# Patient Record
Sex: Male | Born: 1965 | Race: White | Hispanic: Yes | Marital: Married | Smoking: Former smoker
Health system: Southern US, Community
[De-identification: ages and names within clinical notes are randomized; demographics above are authoritative.]

## PROBLEM LIST (undated history)

## (undated) DIAGNOSIS — E119 Type 2 diabetes mellitus without complications: Secondary | ICD-10-CM

## (undated) DIAGNOSIS — N189 Chronic kidney disease, unspecified: Secondary | ICD-10-CM

## (undated) DIAGNOSIS — I10 Essential (primary) hypertension: Secondary | ICD-10-CM

## (undated) HISTORY — PX: TOE AMPUTATION: SHX809

---

## 2005-01-01 ENCOUNTER — Emergency Department (HOSPITAL_COMMUNITY): Admission: EM | Admit: 2005-01-01 | Discharge: 2005-01-01 | Payer: Self-pay | Admitting: Emergency Medicine

## 2009-07-08 ENCOUNTER — Inpatient Hospital Stay (HOSPITAL_COMMUNITY): Admission: EM | Admit: 2009-07-08 | Discharge: 2009-07-12 | Payer: Self-pay | Admitting: Emergency Medicine

## 2009-07-08 ENCOUNTER — Ambulatory Visit: Payer: Self-pay | Admitting: Vascular Surgery

## 2009-07-09 ENCOUNTER — Encounter (INDEPENDENT_AMBULATORY_CARE_PROVIDER_SITE_OTHER): Payer: Self-pay | Admitting: Internal Medicine

## 2009-07-13 ENCOUNTER — Encounter: Payer: Self-pay | Admitting: Internal Medicine

## 2009-07-27 ENCOUNTER — Emergency Department (HOSPITAL_COMMUNITY): Admission: EM | Admit: 2009-07-27 | Discharge: 2009-07-27 | Payer: Self-pay | Admitting: Emergency Medicine

## 2009-08-11 ENCOUNTER — Ambulatory Visit: Payer: Self-pay | Admitting: Internal Medicine

## 2010-01-07 ENCOUNTER — Ambulatory Visit: Payer: Self-pay | Admitting: Internal Medicine

## 2010-01-07 LAB — CONVERTED CEMR LAB
ALT: 25 units/L (ref 0–53)
AST: 21 units/L (ref 0–37)
Albumin: 4.3 g/dL (ref 3.5–5.2)
Alkaline Phosphatase: 99 units/L (ref 39–117)
BUN: 14 mg/dL (ref 6–23)
CO2: 25 meq/L (ref 19–32)
Calcium: 9.6 mg/dL (ref 8.4–10.5)
Creatinine, Ser: 0.8 mg/dL (ref 0.40–1.50)
Total Protein: 7.1 g/dL (ref 6.0–8.3)

## 2010-01-21 ENCOUNTER — Ambulatory Visit: Payer: Self-pay | Admitting: Internal Medicine

## 2010-02-03 ENCOUNTER — Ambulatory Visit: Payer: Self-pay | Admitting: Internal Medicine

## 2010-03-15 ENCOUNTER — Ambulatory Visit: Payer: Self-pay | Admitting: Internal Medicine

## 2010-03-15 LAB — CONVERTED CEMR LAB
Creatinine, Ser: 0.96 mg/dL (ref 0.40–1.50)
HDL: 43 mg/dL (ref >39)
Microalb, Ur: 45.25 mg/dL — ABNORMAL HIGH (ref 0.00–1.89)
Total CHOL/HDL Ratio: 6.3

## 2010-03-23 ENCOUNTER — Ambulatory Visit: Payer: Self-pay | Admitting: Internal Medicine

## 2010-04-13 ENCOUNTER — Ambulatory Visit: Payer: Self-pay | Admitting: Internal Medicine

## 2010-05-04 ENCOUNTER — Ambulatory Visit: Payer: Self-pay | Admitting: Internal Medicine

## 2010-05-04 LAB — CONVERTED CEMR LAB
BUN: 14 mg/dL (ref 6–23)
CO2: 25 meq/L (ref 19–32)
Chloride: 104 meq/L (ref 96–112)
LDL Cholesterol: 102 mg/dL — ABNORMAL HIGH (ref 0–99)
Sodium: 139 meq/L (ref 135–145)

## 2010-05-11 ENCOUNTER — Ambulatory Visit: Payer: Self-pay | Admitting: Internal Medicine

## 2010-05-12 ENCOUNTER — Ambulatory Visit: Payer: Self-pay | Admitting: Internal Medicine

## 2010-06-15 ENCOUNTER — Ambulatory Visit: Payer: Self-pay | Admitting: Internal Medicine

## 2010-06-22 ENCOUNTER — Ambulatory Visit: Payer: Self-pay | Admitting: Internal Medicine

## 2010-12-20 NOTE — Progress Notes (Signed)
Summary: [facility] Nurses Note  [facility] Nurses Note   Imported By: Florinda Marker 05/10/2010 15:05:43  _____________________________________________________________________  External Attachment:    Type:   Image     Comment:   External Document

## 2011-01-11 IMAGING — CR DG FOOT COMPLETE 3+V*R*
3 series · 3 of 3 positions shown · non-contrast
Comparison: None.

CLINICAL DATA: Toe pain.  Abscess.

RIGHT FOOT COMPLETE - 3+ VIEW

[t foot ap right]
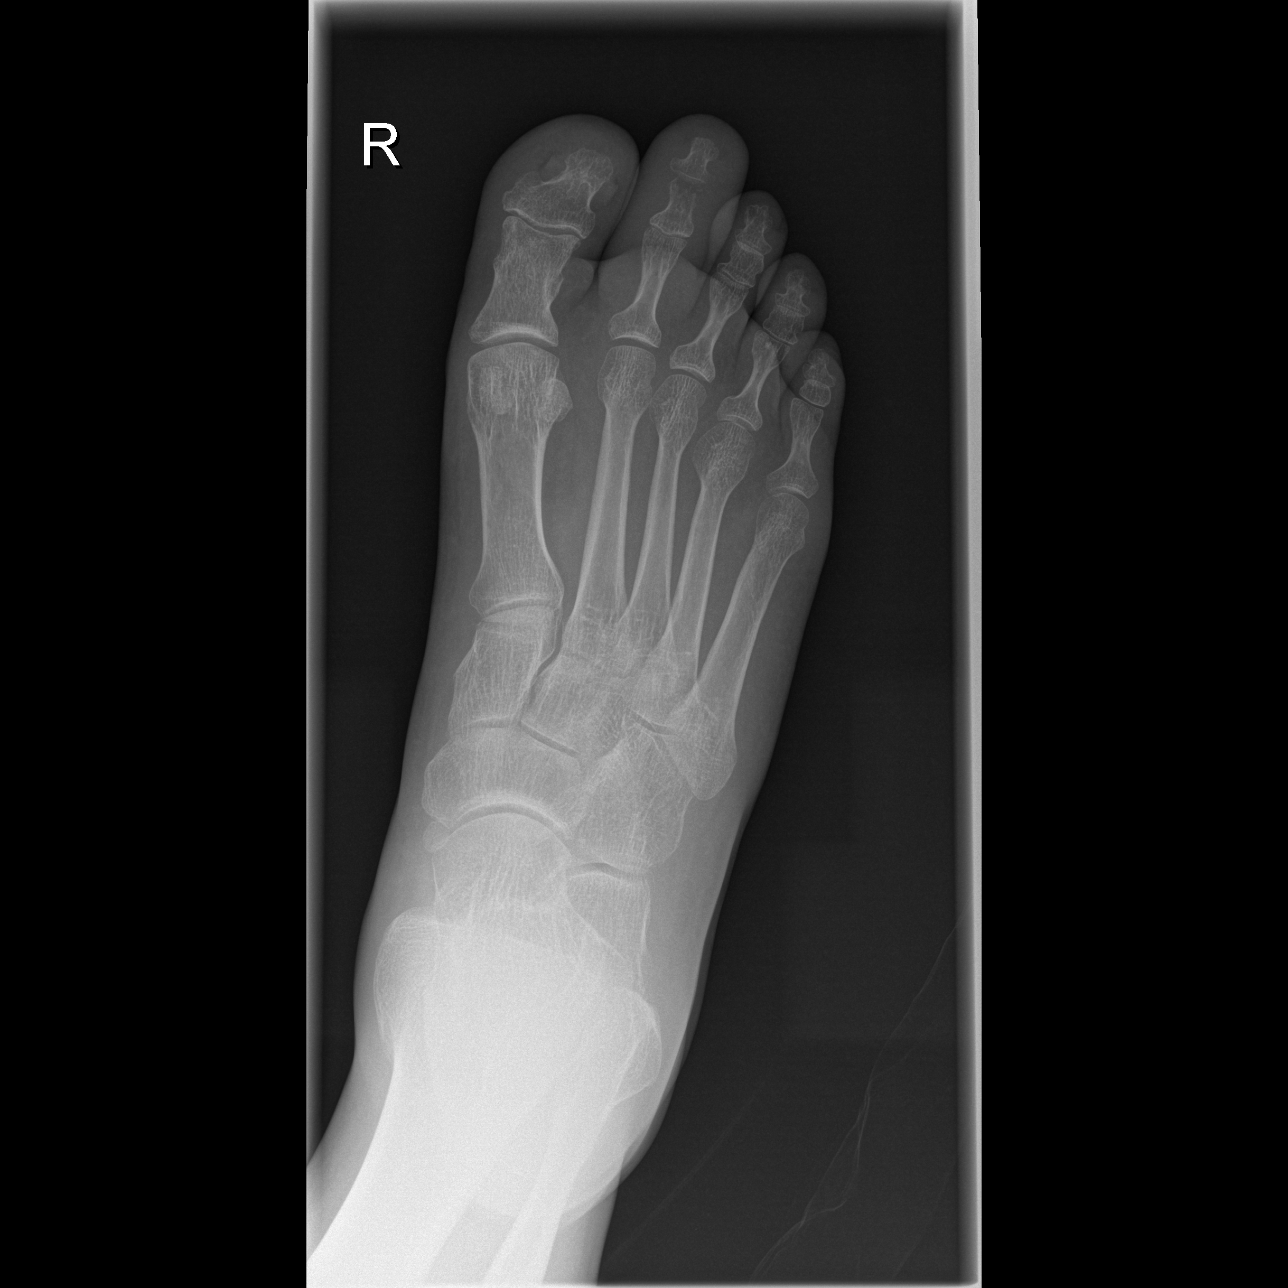

[t foot oblique right]
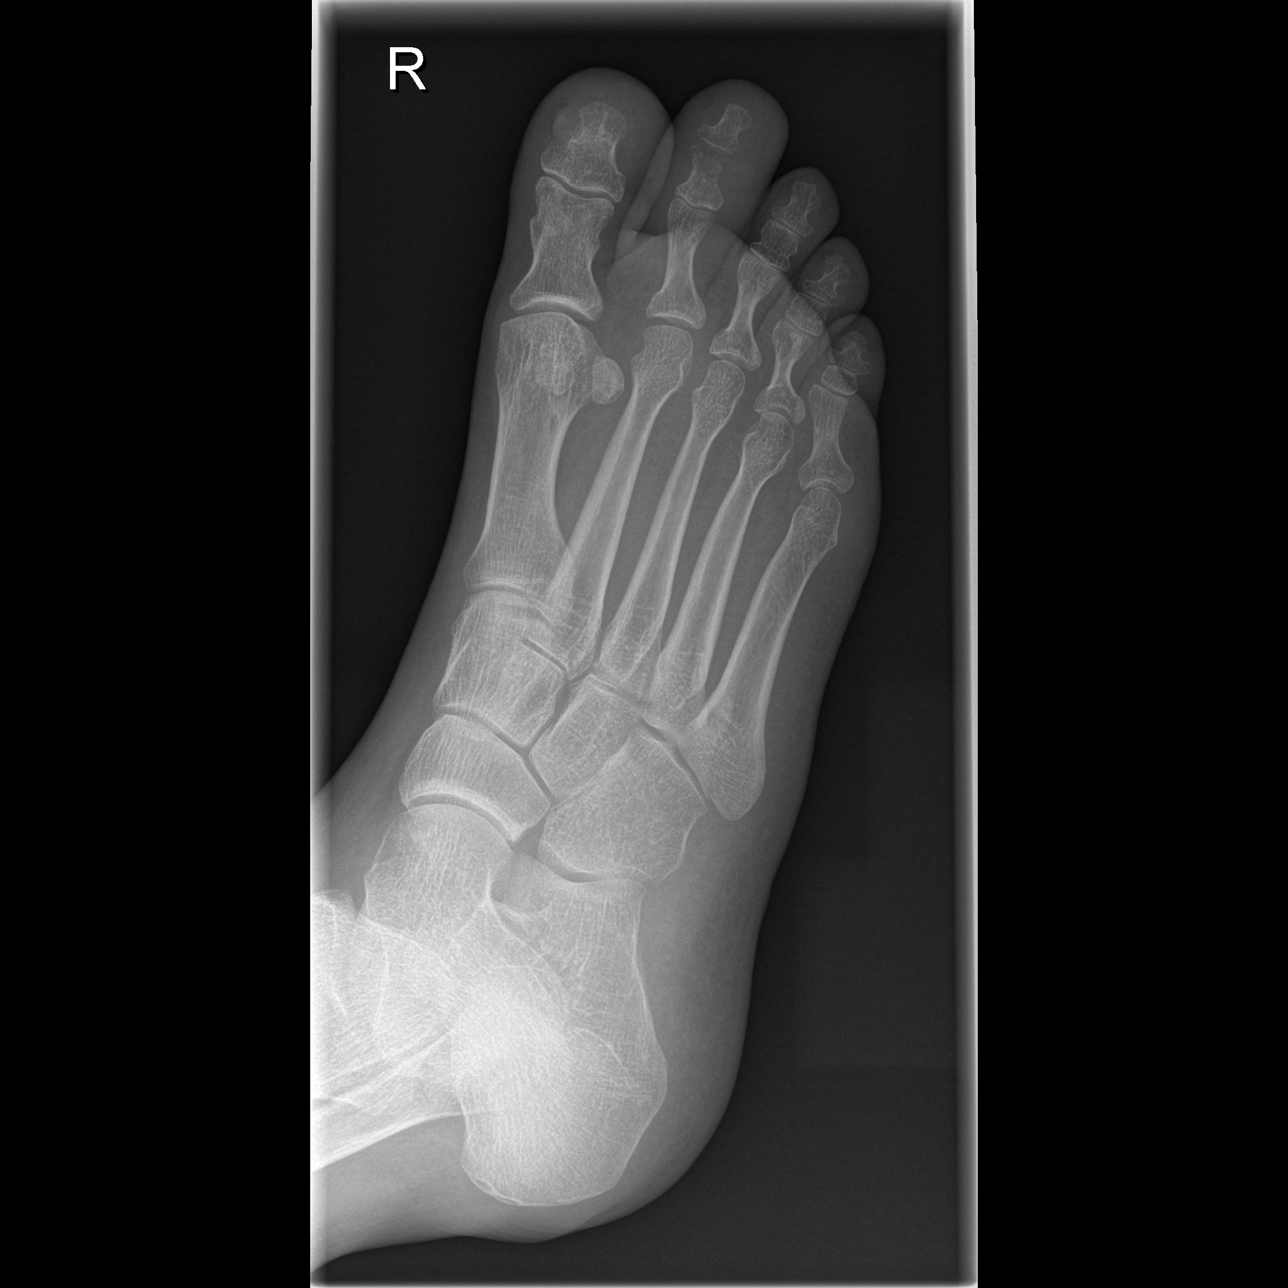

[t foot lat right]
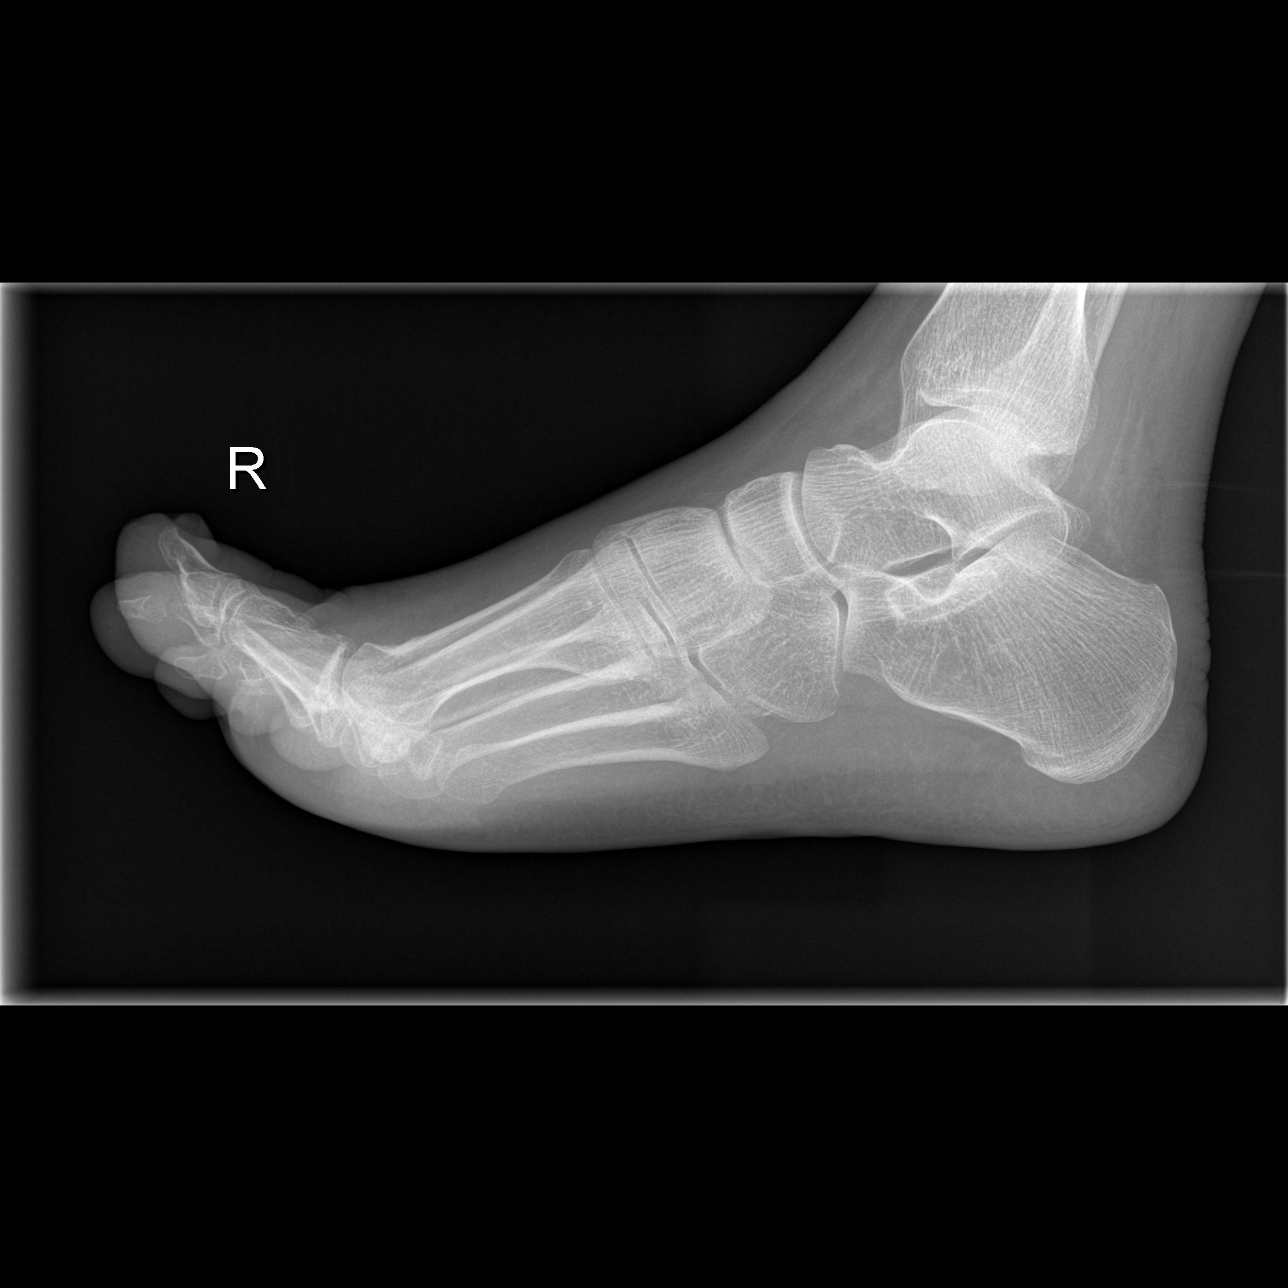

[3 of 3 positions shown; findings below may reference images not displayed]

FINDINGS: There is osteolysis in the distal aspect of the middle
phalanx of the second toe consistent with active osteomyelitis.
The cortex of the distal phalanx appears to remain intact however
septic joint cannot be excluded. There is soft tissue swelling over
the plantar aspect of the great toe.  No gas is present in the
subcutaneous tissues.  Accessory navicular bone is present.  Mild
edema is present in the foot.
IMPRESSION: 1.  Distal aspect middle phalanx second toe osteomyelitis.  Septic
second toe DIP joint not excluded.

## 2011-02-25 LAB — GLUCOSE, CAPILLARY
Glucose-Capillary: 127 mg/dL — ABNORMAL HIGH (ref 70–99)
Glucose-Capillary: 140 mg/dL — ABNORMAL HIGH (ref 70–99)
Glucose-Capillary: 167 mg/dL — ABNORMAL HIGH (ref 70–99)
Glucose-Capillary: 168 mg/dL — ABNORMAL HIGH (ref 70–99)
Glucose-Capillary: 182 mg/dL — ABNORMAL HIGH (ref 70–99)
Glucose-Capillary: 195 mg/dL — ABNORMAL HIGH (ref 70–99)
Glucose-Capillary: 204 mg/dL — ABNORMAL HIGH (ref 70–99)
Glucose-Capillary: 222 mg/dL — ABNORMAL HIGH (ref 70–99)
Glucose-Capillary: 236 mg/dL — ABNORMAL HIGH (ref 70–99)
Glucose-Capillary: 261 mg/dL — ABNORMAL HIGH (ref 70–99)
Glucose-Capillary: 350 mg/dL — ABNORMAL HIGH (ref 70–99)
Glucose-Capillary: 371 mg/dL — ABNORMAL HIGH (ref 70–99)
Glucose-Capillary: 440 mg/dL — ABNORMAL HIGH (ref 70–99)

## 2011-02-25 LAB — DIFFERENTIAL
Basophils Absolute: 0 10*3/uL (ref 0.0–0.1)
Eosinophils Absolute: 0.5 10*3/uL (ref 0.0–0.7)
Monocytes Absolute: 1.3 10*3/uL — ABNORMAL HIGH (ref 0.1–1.0)
Neutrophils Relative %: 74 % (ref 43–77)

## 2011-02-25 LAB — CBC
HCT: 32.9 % — ABNORMAL LOW (ref 39.0–52.0)
HCT: 37.2 % — ABNORMAL LOW (ref 39.0–52.0)
Hemoglobin: 11.3 g/dL — ABNORMAL LOW (ref 13.0–17.0)
MCHC: 33.8 g/dL (ref 30.0–36.0)
MCV: 85.1 fL (ref 78.0–100.0)
Platelets: 318 10*3/uL (ref 150–400)
Platelets: 337 10*3/uL (ref 150–400)
Platelets: 386 10*3/uL (ref 150–400)
RBC: 3.9 MIL/uL — ABNORMAL LOW (ref 4.22–5.81)
RDW: 12.3 % (ref 11.5–15.5)
WBC: 12.7 10*3/uL — ABNORMAL HIGH (ref 4.0–10.5)
WBC: 9.2 10*3/uL (ref 4.0–10.5)

## 2011-02-25 LAB — BASIC METABOLIC PANEL
BUN: 7 mg/dL (ref 6–23)
Calcium: 9.4 mg/dL (ref 8.4–10.5)
Chloride: 96 mEq/L (ref 96–112)
GFR calc non Af Amer: >60 mL/min (ref >60)
GFR calc non Af Amer: >60 mL/min (ref >60)
Glucose, Bld: 475 mg/dL — ABNORMAL HIGH (ref 70–99)
Potassium: 3.7 mEq/L (ref 3.5–5.1)
Potassium: 4.2 mEq/L (ref 3.5–5.1)
Sodium: 131 mEq/L — ABNORMAL LOW (ref 135–145)
Sodium: 140 mEq/L (ref 135–145)

## 2011-02-25 LAB — CULTURE, BLOOD (ROUTINE X 2)

## 2011-02-25 LAB — URINALYSIS, ROUTINE W REFLEX MICROSCOPIC
Bilirubin Urine: NEGATIVE
Ketones, ur: NEGATIVE mg/dL
Leukocytes, UA: NEGATIVE
Nitrite: NEGATIVE
Protein, ur: NEGATIVE mg/dL
Specific Gravity, Urine: 1.037 — ABNORMAL HIGH (ref 1.005–1.030)
Urobilinogen, UA: 0.2 mg/dL (ref 0.0–1.0)
pH: 6.5 (ref 5.0–8.0)

## 2011-02-25 LAB — WOUND CULTURE: Gram Stain: NONE SEEN

## 2011-02-25 LAB — PROTIME-INR: INR: 1 (ref 0.00–1.49)

## 2011-02-25 LAB — ANAEROBIC CULTURE

## 2011-02-25 LAB — TISSUE CULTURE

## 2011-02-25 LAB — HEMOGLOBIN A1C: Mean Plasma Glucose: 344 mg/dL

## 2011-04-04 NOTE — Consult Note (Signed)
NAMEJONNIE, [name] NO.:  [id number]   MEDICAL RECORD NO.:  [id number]          PATIENT TYPE:  INP   LOCATION:  5120                         FACILITY:  [facility]   PHYSICIAN:  Alvy Beal, MD    DATE OF BIRTH:  [birth year]   DATE OF CONSULTATION:  07/09/2009  DATE OF DISCHARGE:                                 CONSULTATION   Consultation was called August 20 at approximately 5 p.m.  The patient  was evaluated at approximately 6 p.m.  Consultation was requested by the  covering primary care physician [name], Dr. Caren Hazy.  The  patient was admitted on 18th, for uncontrolled diabetes.  I was called  this evening for evaluation of right second toe osteomyelitis and  diabetic infected ulcer.   HISTORY:  [name] is a pleasant 45 year old [place] man who was born  in [place] and has lived in [place] for the last 10 years.  He is  presented with his wife who is translating for [place].  I have also been  able to review his notes including his history.   He has not seen a medical doctor for his diabetes and he gets medication  from his family members in [place].  Upon admission, his blood glucose  was 475 and he was noted to have a purulent right second toe with a  small ulceration on the tip of the first toe.  It was inflamed,  enlarged, erythematous with pus emanating from the second DIP joint.  The patient was subsequently admitted for the diabetic ulcer and  uncontrolled diabetes and placed on antibiotics.   During the course of admission, a vascular consult was requested, which  was done on August 19.  Dr. Darrick Penna indicated in his note that the  patient did not have any evidence of inadequate blood supply and there  was no need for revascularization.  He recommended an ortho consult for  possible amputation.  This evening at approximately 5 p.m., I was called  to see the patient.   His past medical, surgical, family, social history is significant for  diabetes, uncontrolled.  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  He smokes and drinks on occasion.  He is married and lives with  his wife and 2 children.   REVIEW OF SYSTEMS:  Outlined in the H and P from July 08, 2009.  Please refer to it for specifics.   CLINICAL EXAMINATION:  VITAL SIGNS:  He is currently afebrile, 97.9 with  stable vital signs.  GENERAL:  He is currently in bed, resting.  EXTREMITIES:  He has no immobilization on the lower extremity.  He has  an obvious inflamed and enlarged second toe that is erythematous and  purulent material was expressible.  He has no sensation.  The first toe  has a small ulceration at the tip.  He is able to move the toe, but he  again has diminished sensation to light touch.  He does have palpable  pulses at the dorsalis pedis.  He has no ankle pain with range of  motion.  No knee pain or hip pain with range of motion.  CHEST:  He has no shortness of breath or chest pain.  ABDOMEN:  Soft and nontender.   He has no fevers or chills since admission.   X-rays done of his foot yesterday on the 19th, demonstrate no evidence  of osteo of the first digit.  He does have distal aspect of the middle  phalanx of the second toe, has obvious signs of osteomyelitis.   There is no evidence of osteo in the great toe.   PLAN:  At this point in time, his wife is kind enough to interpret and  convey the plan.  The patient has an infected second toe for an  undetermined amount of time.  It is a question of either 1-3 weeks.  Although, he shows no signs of sepsis at present and he has ulceration  with frank purulence with evidence of bony changes consistent with  osteomyelitis as well as septic joint.  I have discussed with him  treatment plans which include nonoperative treatments such as IV  antibiotics and wound care versus amputation.  At this point, given the  extent to the changes, I would recommend an amputation.  I have   discussed this with the patient and his wife.  I have indicated the  risks which include infection, bleeding, nerve damage, death, stroke,  paralysis, failure to heal, need for further surgery, ongoing or worse  pain.  I have also indicated that he may require more surgery depending  upon how the ulceration on the first toe heals.  At this point, he will  do this amputation.  I do recommend a formal I and D consult, wound care  evaluation for postop followup and he will need a primary care physician  to coordinate his overall care, especially with respect to his diabetes  which has essentially been untreated.      Alvy Beal, MD  Electronically Signed     DDB/MEDQ  D:  07/09/2009  T:  07/10/2009  Job:  [id number]

## 2011-04-04 NOTE — Op Note (Signed)
NAME:  [name], [name]               ACCOUNT NO.:  [id number]   MEDICAL RECORD NO.:  [id number]          PATIENT TYPE:  INP   LOCATION:  5120                         FACILITY:  [facility]   PHYSICIAN:  Alvy Beal, MD    DATE OF BIRTH:  11/14/1966   DATE OF PROCEDURE:  DATE OF DISCHARGE:                               OPERATIVE REPORT   PREOPERATIVE DIAGNOSIS:  Diabetic osteomyelitis of the second toe and  first toe ulceration.   POSTOPERATIVE DIAGNOSIS:  Diabetic osteomyelitis of the second toe and  first toe ulceration.   OPERATIVE PROCEDURE:  1. Incision and drainage of the great toe ulcer.  2. Amputation an incision and drainage of the second toe.   COMPLICATIONS:  None.   CONDITION:  Stable.   HISTORY:  This is a pleasant 45 year old gentleman who has been having  progressive purulent material, swelling, and deformity of the second toe  for at least 1-3 weeks.  He was admitted two days ago in to hospital.  I  was consulted this evening concerning management of the osteoarthritis.  The patient had a purulent toe, and the decision was made to take him to  the operating room for a formal I&D and amputation.  All appropriate  risks, benefits, and alternatives were discussed with the patient and  his wife.  Consent was obtained.   FIRST ASSISTANT:  Crissie Reese, PA   OPERATIVE NOTE:  The patient was brought to the operating room and  placed supine on the operating table.  After successful induction of  general anesthesia and endotracheal intubation, the right lower  extremity was prepped and draped in standard fashion.  Appropriate  surgical time-out was done confirming that the extremity was marked by  me, the surgeon, and we confirmed procedure, toe to be amputated, and  the extremity.  Once done, I proceeded with the surgery.  A racket-type  incision was made, centered about the second metatarsal.  Sharp  dissection was carried out down to the deep tissue.  The toe  itself was  removed at the MTP joint.  Once the toe had been removed, the cultures  were taken.  The specimen was sent to Pathology for permanent  evaluation.   At this point, the ulceration at the tip of the distal great toe was  removed.  I could palpate the underlying bone, but it did not appear to  be grossly infected, and there was no significant purulent material.  There was also no palpable abscess.  At this point, I elected not to  amputate the first toe or proceed with further I&D as I had viable  tissue.  At this point, using pulse lavage,  I irrigated both wounds  copiously with normal saline.  Once this was completed, I reapproximated  the toe amputation site, but I did leave an area open for postoperative  drainage.  I then placed a  bulky dry dressing and a posterior splint.  The patient was extubated,  transferred to the PACU without incident.  He will be admitted back to  the Medical Team for appropriate ongoing medical  management including  management of his diabetes.      Alvy Beal, MD  Electronically Signed     DDB/MEDQ  D:  07/09/2009  T:  07/10/2009  Job:  [id number]

## 2011-04-04 NOTE — Discharge Summary (Signed)
[name], [name]               ACCOUNT NO.:  [id number]   MEDICAL RECORD NO.:  [id number]          PATIENT TYPE:  INP   LOCATION:  5120                         FACILITY:  [facility]   PHYSICIAN:  Richarda Overlie, MD       DATE OF BIRTH:  05/10/1966   DATE OF ADMISSION:  07/07/2009  DATE OF DISCHARGE:  07/12/2009                               DISCHARGE SUMMARY   PRIMARY CARE PHYSICIAN:  Unassigned.   DISCHARGE DIAGNOSES:  1. Uncontrolled type 2 diabetes.  2. Diabetic foot ulcer affecting the right big toe and the 2nd toe.  3. Osteomyelitis of the right 2nd toe.  4. Status post incision and drainage of the great toe ulcer and      amputation and incision and drainage of the 2nd toe.   SUBJECTIVE:  This is a 45 year old male with a history of uncontrolled  type 2 diabetes who presents to the ER with a chief complaint of  elevated blood sugars.  The patient states that the patient's sugars  have been running in the 400 range at home.  The patient takes only  metformin and he gets his prescription filled in [place].  The patient  was found to have osteomyelitis of the 2nd toe of the right foot, as  well as ulceration of his 1st toe.  In the ER, patient's x-ray showed  that the distal aspect of the middle phalanx of the 2nd toe had  osteomyelitis and a septic 2nd toe.  DIP joint was also considered.  The  patient's initial sugar was 475 on admission.  Patient was admitted for  further evaluation.   CONSULTATION:  Dr. Venita Lick, Orthopedics.   HOSPITAL COURSE:  1. Diabetic foot ulcer.  The patient's x-ray of the foot confirmed      osteomyelitis.  He was initiated on Unasyn IV and vancomycin IV.      Patient's ankle brachial index study did not show any obvious      peripheral vascular disease.  Initially Vascular Surgery was      consulted but they referred the  amputation to the Orthopedic      Service.  Dr. Venita Lick saw the patient on July 09, 2009, and      the patient had  amputation and incision and drainage of the 2nd toe      and incision and drainage of the great toe ulcer on July 09, 2009.  Patient had blood cultures drawn on July 08, 2009, which      have remained negative to date.  His wound culture from July 08, 2009, shows moderate group B strep.  Anaerobic culture remained      negative.  I discussed this with Dr. Maurice March who recommended that if      the patient had complete resection of his osteomyelitic bone then      no IV antibiotics are needed.  The patient has remained afebrile,      his white count has improved, and he is being discharged on a total  of 10-day course of p.o. Avelox which is being provided by the      hospital prior to his discharge.  2. Uncontrolled diabetes.  The patient was found to have a hemoglobin      A1c of 13.6.  The patient was initiated on insulin therapy.      Because of cost concerns, the patient will be discharged home on      NPH and regular insulin sliding scale.   DISPOSITION:  1. The patient was recommended to be nonweightbearing by Orthopedic      Service.  For dressing changes, Dr. Shon Baton and the Orthopedic      Service has been requested to make their recommendations.  The      patient will be provided with a prescription for walker, crutches      based upon PT/OT assessment.  2. Follow up with Dr. Shon Baton in about 2 weeks.  Patient to call phone      number provided, [phone].  Patient also has an appointment  at      [facility] on August 11, 2009, at 11 a.m.  3. [facility] has been arranged for  dressing changes and      management of his diabetes, phone number [phone].   DISCHARGE MEDICATIONS:  1. Avelox 400 mg p.o. daily x8 days.  2. NPH 25 units subcu in the morning, 15 units subcu in the evening.  3. Norco 5/325 p.o. every 4 hours p.r.n.  4. Insulin regular sliding scale 200 to 250, 2 units, 250 to 300, 4      units, 300 to 350, 6 units, 350 to 400, 8 units, 400 to  450, 10      units, greater than 450, 12 units, and patient instructed to call      M.D.      Richarda Overlie, MD  Electronically Signed     NA/MEDQ  D:  07/12/2009  T:  07/12/2009  Job:  [id number]

## 2011-04-04 NOTE — H&P (Signed)
[name], [name]               ACCOUNT NO.:  [id number]   MEDICAL RECORD NO.:  [id number]          PATIENT TYPE:  INP   LOCATION:  5120                         FACILITY:  [facility]   PHYSICIAN:  Virgie Dad, MD     DATE OF BIRTH:  05/24/1966   DATE OF ADMISSION:  07/08/2009  DATE OF DISCHARGE:                              HISTORY & PHYSICAL   CHIEF COMPLAINT:  Uncontrolled diabetes.   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  He did not  see any medical doctor for his diabetes in [place], but he did say that  his sister who lives in [place] mails the metformin that the sister buys  in [place] without prescription and when it is available to him he takes  them.  The patient came to the emergency room because he has a sore in  his second toe of the right foot that has not healed for the last 3  weeks.  Physical finding show that the patient has diabetic foot ulcer  involving the big toe but most severely in the second right toe with  purulent exudate and surrounding erythema and cellulitis.  The x-ray of  the foot show that the distal aspect of the middle phalanx of the second  toe has osteomyelitis and the septic second toe DIP joint is also  considered.  The patient stated that he does not test his blood sugar,  and he takes the metformin only when available as mailed from [place].  On admission, the blood sugar was 475 mg percent.  The rest of the  physical examination, cardiopulmonary findings are negative.  Abdomen  negative.  Lower extremities other than the diabetic foot ulcer he has  signs of chronic venous insufficiency and signs of peripheral vascular  disease.   PAST HISTORY:  No previous admission for any serious illness.   SOCIAL HISTORY:  Works as a Corporate investment banker and odd jobs, smokes and  drinks beer  every now and then.  He is married, lives with his wife and  2 children.   FAMILY HISTORY:  Strong history of diabetes in the family both maternal  and paternal side.   REVIEW OF SYSTEMS:  Recurrent headaches with blurring of vision,  persistent nausea, dyspepsia, polyuria, polydipsia and weight loss  recently.  At one time, 1 week ago he had stated that he may have chills  and fever when the right second big toe was oozing with pus.   PHYSICAL EXAMINATION:  VITAL SIGNS:  Blood pressure 133/80, pulse 110,  respirations 18, temperature 101.3, pulse oximetry at room air is 96%.  SKIN:  Changes, pulse noted in the lower extremity.  There is some  erythema and cellulitis of the dorsum of the right foot and the  immediate skin surrounding the diabetic ulcers of the right big toe and  the second toe are smelly with purulent exudate.  HEENT:  Pupils are equal  and reacting to light.  No scleral icterus.  The nasopharynx is moist.  NECK:  Thyroid is normal in size.  Carotids normal upstroke.  Neck veins  are not distended.  CHEST:  Clear to palpation and auscultation.  COR:  110  per minute.  S1 and S2 normal.  No murmur.  No pericardial  rub.  ABDOMEN:  Scaphoid, easy to palpate liver and spleen.  Normal bowel  sounds.  No bruit over the aorta.  EXTREMITIES:  Signs of chronic venous insufficiency and peripheral  vascular disease with a diminished pedal pulses.  More significant  finding is in the right foot.  The right big toe has callous formation  with some oozing purulent exudates and surrounding erythema.  The second  big toe has blister formation and deformity of the toe at the PIP joint.  The open ulcer exudes foul smelling purulent exudate and the base of the  second big toe show cellulitis which extends towards the dorsum of the  right foot.  NEUROLOGIC:  Spinal and cranial nerves normal.  Cerebral function  normal.  Cerebellar function normal including gait, although he tends   not to bear weight on the right foot because it is tender and soar when  you put weight on it.   ASSESSMENT AND PLAN:  1. Diabetic foot ulcer and affecting both the right big toe and the      second toe.  X-ray of the foot confirm that the right second toe      has osteomyelitis.  The wound was cultured, blood cultures were      taken and Unasyn 2 g IV q.6 h. was started, pending cultures.  I do      not think we need an MRI to establish osteomyelitis.  The plain x-      ray of the foot confirm osteomyelitic changes of the second big      toe.  Surgical consult is considered if it does not respond to      antibiotics and he will need a PICC line for long term intravenous      antibiotic.   1. Uncontrolled diabetes mellitus with no medical care in [place].      The patient takes metformin every now and then when his sister from      [place] mails him the medication.  We will take A1c to see how the      degree of control of the diabetes.  In the meantime, we will stop      the metformin and put the patient on sliding scale regular insulin      and later on switch to Lantus insulin for better control of the      diabetes.  Prognosis is guarded most likely the second big toe will      need to be amputated.if it does not respond to long-term      intravenous antibiotics.   PROGNOSIS :Guarded   ETHICS:is Full code.  This was explained to the patient and what I  understood from the wife who speaks fluent English that they want  everything done and full resuscitation has been decided.      Virgie Dad, MD  Electronically Signed     EA/MEDQ  D:  07/08/2009  T:  07/09/2009  Job:  [id number]

## 2011-04-04 NOTE — H&P (Signed)
NAMEJESIEL, GARATE               ACCOUNT NO.:  [id number]   MEDICAL RECORD NO.:  [id number]          PATIENT TYPE:  INP   LOCATION:  5120                         FACILITY:  [facility]   PHYSICIAN:  Virgie Dad, MD     DATE OF BIRTH:  [birth year]   DATE OF ADMISSION:  07/07/2009  DATE OF DISCHARGE:                              HISTORY & PHYSICAL   PRIMARY CARE PHYSICIAN:  Unassigned.   CHIEF COMPLAINT:  Abdominal pain with vomiting of 5 days' duration.   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  Five days prior to onset of symptoms, he had partaken of a  rice dish with the whole family.  He became sick, started getting  nauseous, and continued to have on-and-off abdominal pains.  Pain is so  crampy that he had gone to see a doctor, and he was given omeprazole and  Phenergan and Cipro.  The patient stated that he could not take the  Cipro; every time he takes it, he throws up some more, and for the last  5 days, the abdominal cramp is getting worse and worse.   CANCELED DICTATION.      Virgie Dad, MD     EA/MEDQ  D:  07/08/2009  T:  07/09/2009  Job:  [id number]

## 2013-12-04 ENCOUNTER — Other Ambulatory Visit: Payer: Self-pay

## 2013-12-04 ENCOUNTER — Inpatient Hospital Stay (HOSPITAL_COMMUNITY)
Admission: EM | Admit: 2013-12-04 | Discharge: 2013-12-08 | DRG: 641 | Disposition: A | Discharge status: Home / Self Care | Payer: Medicaid Other | Attending: Internal Medicine | Admitting: Internal Medicine

## 2013-12-04 ENCOUNTER — Encounter (HOSPITAL_COMMUNITY): Payer: Self-pay | Admitting: Emergency Medicine

## 2013-12-04 DIAGNOSIS — E876 Hypokalemia: Principal | ICD-10-CM | POA: Diagnosis present

## 2013-12-04 DIAGNOSIS — R5383 Other fatigue: Secondary | ICD-10-CM

## 2013-12-04 DIAGNOSIS — N189 Chronic kidney disease, unspecified: Secondary | ICD-10-CM

## 2013-12-04 DIAGNOSIS — E8809 Other disorders of plasma-protein metabolism, not elsewhere classified: Secondary | ICD-10-CM | POA: Diagnosis present

## 2013-12-04 DIAGNOSIS — I10 Essential (primary) hypertension: Secondary | ICD-10-CM

## 2013-12-04 DIAGNOSIS — N183 Chronic kidney disease, stage 3 unspecified: Secondary | ICD-10-CM | POA: Diagnosis present

## 2013-12-04 DIAGNOSIS — K5900 Constipation, unspecified: Secondary | ICD-10-CM | POA: Diagnosis present

## 2013-12-04 DIAGNOSIS — R5381 Other malaise: Secondary | ICD-10-CM

## 2013-12-04 DIAGNOSIS — E860 Dehydration: Secondary | ICD-10-CM | POA: Diagnosis present

## 2013-12-04 DIAGNOSIS — T502X5A Adverse effect of carbonic-anhydrase inhibitors, benzothiadiazides and other diuretics, initial encounter: Secondary | ICD-10-CM

## 2013-12-04 DIAGNOSIS — E119 Type 2 diabetes mellitus without complications: Secondary | ICD-10-CM | POA: Diagnosis present

## 2013-12-04 DIAGNOSIS — R531 Weakness: Secondary | ICD-10-CM | POA: Diagnosis present

## 2013-12-04 DIAGNOSIS — I129 Hypertensive chronic kidney disease with stage 1 through stage 4 chronic kidney disease, or unspecified chronic kidney disease: Secondary | ICD-10-CM | POA: Diagnosis present

## 2013-12-04 DIAGNOSIS — K219 Gastro-esophageal reflux disease without esophagitis: Secondary | ICD-10-CM | POA: Diagnosis present

## 2013-12-04 DIAGNOSIS — I951 Orthostatic hypotension: Secondary | ICD-10-CM

## 2013-12-04 DIAGNOSIS — J309 Allergic rhinitis, unspecified: Secondary | ICD-10-CM

## 2013-12-04 DIAGNOSIS — N179 Acute kidney failure, unspecified: Secondary | ICD-10-CM | POA: Diagnosis present

## 2013-12-04 DIAGNOSIS — IMO0001 Reserved for inherently not codable concepts without codable children: Secondary | ICD-10-CM | POA: Diagnosis present

## 2013-12-04 DIAGNOSIS — D649 Anemia, unspecified: Secondary | ICD-10-CM | POA: Diagnosis present

## 2013-12-04 DIAGNOSIS — E1165 Type 2 diabetes mellitus with hyperglycemia: Secondary | ICD-10-CM

## 2013-12-04 DIAGNOSIS — D638 Anemia in other chronic diseases classified elsewhere: Secondary | ICD-10-CM | POA: Diagnosis present

## 2013-12-04 DIAGNOSIS — E440 Moderate protein-calorie malnutrition: Secondary | ICD-10-CM | POA: Insufficient documentation

## 2013-12-04 DIAGNOSIS — D72829 Elevated white blood cell count, unspecified: Secondary | ICD-10-CM | POA: Diagnosis present

## 2013-12-04 HISTORY — DX: Chronic kidney disease, unspecified: N18.9

## 2013-12-04 HISTORY — DX: Essential (primary) hypertension: I10

## 2013-12-04 HISTORY — DX: Type 2 diabetes mellitus without complications: E11.9

## 2013-12-04 LAB — URINALYSIS, ROUTINE W REFLEX MICROSCOPIC
Bilirubin Urine: NEGATIVE
Glucose, UA: 250 mg/dL — AB
KETONES UR: NEGATIVE mg/dL
LEUKOCYTES UA: NEGATIVE
NITRITE: NEGATIVE
PH: 7 (ref 5.0–8.0)
Protein, ur: >300 mg/dL — AB
SPECIFIC GRAVITY, URINE: 1.01 (ref 1.005–1.030)
Urobilinogen, UA: 0.2 mg/dL (ref 0.0–1.0)

## 2013-12-04 LAB — POCT I-STAT, CHEM 8
BUN: 24 mg/dL — AB (ref 6–23)
CHLORIDE: 94 meq/L — AB (ref 96–112)
CREATININE: 2.4 mg/dL — AB (ref 0.50–1.35)
Calcium, Ion: 1.16 mmol/L (ref 1.12–1.23)
GLUCOSE: 136 mg/dL — AB (ref 70–99)
HCT: 27 % — ABNORMAL LOW (ref 39.0–52.0)
Hemoglobin: 9.2 g/dL — ABNORMAL LOW (ref 13.0–17.0)
POTASSIUM: 2.7 meq/L — AB (ref 3.7–5.3)
SODIUM: 136 meq/L — AB (ref 137–147)
TCO2: 30 mmol/L (ref 0–100)

## 2013-12-04 LAB — COMPREHENSIVE METABOLIC PANEL
ALBUMIN: 1.3 g/dL — AB (ref 3.5–5.2)
ALT: 8 U/L (ref 0–53)
AST: 16 U/L (ref 0–37)
Alkaline Phosphatase: 113 U/L (ref 39–117)
BUN: 25 mg/dL — ABNORMAL HIGH (ref 6–23)
CHLORIDE: 96 meq/L (ref 96–112)
CO2: 30 mEq/L (ref 19–32)
CREATININE: 2.34 mg/dL — AB (ref 0.50–1.35)
Calcium: 8.5 mg/dL (ref 8.4–10.5)
GFR calc Af Amer: 37 mL/min — ABNORMAL LOW (ref >90)
GFR calc non Af Amer: 32 mL/min — ABNORMAL LOW (ref >90)
Glucose, Bld: 132 mg/dL — ABNORMAL HIGH (ref 70–99)
POTASSIUM: 2.5 meq/L — AB (ref 3.7–5.3)
SODIUM: 138 meq/L (ref 137–147)
Total Protein: 5.7 g/dL — ABNORMAL LOW (ref 6.0–8.3)

## 2013-12-04 LAB — CBC
HEMATOCRIT: 26.3 % — AB (ref 39.0–52.0)
HEMOGLOBIN: 9.2 g/dL — AB (ref 13.0–17.0)
MCH: 28.1 pg (ref 26.0–34.0)
MCHC: 35 g/dL (ref 30.0–36.0)
MCV: 80.4 fL (ref 78.0–100.0)
Platelets: 763 10*3/uL — ABNORMAL HIGH (ref 150–400)
RBC: 3.27 MIL/uL — AB (ref 4.22–5.81)
RDW: 12.5 % (ref 11.5–15.5)
WBC: 16.5 10*3/uL — ABNORMAL HIGH (ref 4.0–10.5)

## 2013-12-04 LAB — GLUCOSE, CAPILLARY: GLUCOSE-CAPILLARY: 134 mg/dL — AB (ref 70–99)

## 2013-12-04 LAB — URINE MICROSCOPIC-ADD ON

## 2013-12-04 LAB — MAGNESIUM: Magnesium: 2.1 mg/dL (ref 1.5–2.5)

## 2013-12-04 MED ORDER — POTASSIUM CHLORIDE CRYS ER 20 MEQ PO TBCR
40.0000 meq | EXTENDED_RELEASE_TABLET | Freq: Once | ORAL | Status: AC
  Administered 2013-12-04: 40 meq via ORAL
  Filled 2013-12-04: qty 2

## 2013-12-04 MED ORDER — POTASSIUM CHLORIDE 10 MEQ/100ML IV SOLN
10.0000 meq | Freq: Once | INTRAVENOUS | Status: AC
  Administered 2013-12-04: 10 meq via INTRAVENOUS
  Filled 2013-12-04: qty 100

## 2013-12-04 MED ORDER — SODIUM CHLORIDE 0.9 % IV BOLUS (SEPSIS)
1000.0000 mL | Freq: Once | INTRAVENOUS | Status: AC
  Administered 2013-12-04: 1000 mL via INTRAVENOUS

## 2013-12-04 NOTE — H&P (Signed)
  Chief Complaint:  Generalized weakness  HPI: 48 yo male h/o ckd, dm, htn has been having weakness for 3 weeks progressively getting worse.  No focal neuro deficits.  No n/v/d.  Had some generalized swelling last month and was started on lasix.  Since then his swelling has improved.  Not on k pills.  Also on lisinopril, hctz, metformin, and insulin.  No fevers.  No sob no cp.  k found to be 2.5.  He does not know what his baseline cr is.  Review of Systems:  Positive and negative as per HPI otherwise all other systems are negative  Past Medical History: History reviewed. No pertinent past medical history. History reviewed. No pertinent past surgical history.  Medications: Prior to Admission medications   Not on File    Allergies:  No Known Allergies  Social History: Lives with wife.  Mainly spanish speaking.  Family History: History reviewed. No pertinent family history.  Physical Exam: Filed Vitals:   12/04/13 2300 12/04/13 2330 12/05/13 0005 12/05/13 0030  BP: 167/89 147/85 169/93 140/87  Pulse: 79 76 79 76  Temp:   98.4 F (36.9 C)   TempSrc:   Oral   Resp: 23 16 20 18   Height:      SpO2: 99% 98% 99% 99%   General appearance: alert, cooperative and no distress Head: Normocephalic, without obvious abnormality, atraumatic Eyes: negative Nose: Nares normal. Septum midline. Mucosa normal. No drainage or sinus tenderness. Neck: no JVD and supple, symmetrical, trachea midline Lungs: clear to auscultation bilaterally Heart: regular rate and rhythm, S1, S2 normal, no murmur, click, rub or gallop Abdomen: soft, non-tender; bowel sounds normal; no masses,  no organomegaly Extremities: extremities normal, atraumatic, no cyanosis or edema Pulses: 2+ and symmetric Skin: Skin color, texture, turgor normal. No rashes or lesions Neurologic: Grossly normal   Labs on Admission:   Recent Labs  12/04/13 2003 12/04/13 2031  NA 136* 138  K 2.7* 2.5*  CL 94* 96  CO2  --  30   GLUCOSE 136* 132*  BUN 24* 25*  CREATININE 2.40* 2.34*  CALCIUM  --  8.5  MG  --  2.1    Recent Labs  12/04/13 2031  AST 16  ALT 8  ALKPHOS 113  BILITOT <0.2*  PROT 5.7*  ALBUMIN 1.3*    Recent Labs  12/04/13 1924 12/04/13 2003  WBC 16.5*  --   HGB 9.2* 9.2*  HCT 26.3* 27.0*  MCV 80.4  --   PLT 763*  --     Radiological Exams on Admission: No results found.  Assessment/Plan  48 yo male with generalized weakness, significant hypokalemia likely due to recent diuretic use  Principal Problem:   Diuretic-induced hypokalemia-  Has received 2 runs of kcl. Repeat k now, provide 40 more meq, also given some in ivf and po.    Active Problems:   Weakness generalized due to above   CKD (chronic kidney disease)  Unsure if this is acute on chronic or his baseline.  Repeat in am.   Diabetes mellitus  stable   Anemia  outpt w/u   Hypokalemia  As above   Hypoalbuminemia  Unsure why he was placed on lasix, but report general swelling.  Alb and total protein are very low.  No prev labs to compare with.  Will ck spep and upep.    Amarissa Koerner A 12/05/2013, 1:00 AM

## 2013-12-04 NOTE — ED Notes (Signed)
CRITICAL VALUE ALERT  Critical value received: Christin BachBrittney Lavoy Bernards   Date of notification:  12/04/2013  Time of notification:  2135  Critical value read back: Yes   Nurse who received alert:  Christin BachBrittney Liah Morr

## 2013-12-04 NOTE — ED Notes (Signed)
Admitting MD at bedside.

## 2013-12-04 NOTE — ED Notes (Addendum)
Went to pcp and said, "bp low." Dr. Andrey CotaStates, "needs to stop taking med and come to ED.:"

## 2013-12-04 NOTE — ED Provider Notes (Signed)
CSN: [id number]     Arrival date & time 12/04/13  1840 History   First MD Initiated Contact with Patient 12/04/13 1952     Chief Complaint  Patient presents with  . Hypotension   (Consider location/radiation/quality/duration/timing/severity/associated sxs/prior Treatment) Patient is a 48 y.o. male presenting with general illness. The history is provided by the patient. The history is limited by a language barrier. A language interpreter was used.  Illness Severity:  Mild Onset quality:  Gradual Duration:  2 weeks Timing:  Constant Progression:  Worsening Chronicity:  New Associated symptoms: diarrhea   Associated symptoms: no abdominal pain, no chest pain, no congestion, no cough, no fatigue, no fever, no headaches, no myalgias, no nausea, no rash, no rhinorrhea, no shortness of breath, no sore throat and no vomiting    Patient is a 48 year old male with a past medical history of diabetes and chronic kidney disease who comes in with a chief complaint of being unable to walk for the past week. Patient was seen by his PCP at home and found to be hypotensive and had a yellowish color to the skin. Visualized and advised him to be more [place] evaluation. Patient denies any fever chills cough diarrhea. Patient states that about 15 days ago he did have an episode of diarrhea throughout the day but this resolved.  Patient states that he is a poorly controlled diabetic also had a recent swelling in his legs and his family doctor was told him not give rid of the fluid was given Lasix and he said he has had good resolution of this problem. History reviewed. No pertinent past medical history. History reviewed. No pertinent past surgical history. History reviewed. No pertinent family history. History  Substance Use Topics  . Smoking status: Not on file  . Smokeless tobacco: Not on file  . Alcohol Use: Not on file    Review of Systems  Constitutional: Negative for fever, chills and  fatigue.  HENT: Negative for congestion, facial swelling, rhinorrhea and sore throat.   Eyes: Negative for discharge and visual disturbance.  Respiratory: Negative for cough and shortness of breath.   Cardiovascular: Negative for chest pain and palpitations.  Gastrointestinal: Positive for diarrhea. Negative for nausea, vomiting and abdominal pain. Constipation: x15 days ago, but resolved.  Musculoskeletal: Negative for arthralgias and myalgias.  Skin: Positive for color change. Negative for rash.  Neurological: Positive for weakness. Negative for tremors, syncope and headaches.  Psychiatric/Behavioral: Negative for confusion and dysphoric mood.    Allergies  Review of patient's allergies indicates no known allergies.  Home Medications  No current outpatient prescriptions on file. BP 140/87  Pulse 76  Temp(Src) 98.4 F (36.9 C) (Oral)  Resp 18  Ht 5\' 10"  (1.778 m)  SpO2 99% Physical Exam  Constitutional: He is oriented to person, place, and time. He appears well-nourished. He appears cachectic.  HENT:  Head: Normocephalic and atraumatic.  Eyes: EOM are normal. Pupils are equal, round, and reactive to light.  Neck: Normal range of motion. Neck supple. No JVD present.  Cardiovascular: Normal rate and regular rhythm.  Exam reveals no gallop and no friction rub.   No murmur heard. Pulmonary/Chest: No respiratory distress. He has no wheezes.  Abdominal: He exhibits no distension. There is no rebound and no guarding.  Musculoskeletal: Normal range of motion. He exhibits no edema and no tenderness.  Neurological: He is alert and oriented to person, place, and time.  Skin: No rash noted. No pallor.  jaundiced  Psychiatric: He has a normal mood and affect. His behavior is normal.    ED Course  Procedures (including critical care time) Labs Review Labs Reviewed  CBC - Abnormal; Notable for the following:    WBC 16.5 (*)    RBC 3.27 (*)    Hemoglobin 9.2 (*)    HCT 26.3 (*)     Platelets 763 (*)    All other components within normal limits  GLUCOSE, CAPILLARY - Abnormal; Notable for the following:    Glucose-Capillary 134 (*)    All other components within normal limits  URINALYSIS, ROUTINE W REFLEX MICROSCOPIC - Abnormal; Notable for the following:    Glucose, UA 250 (*)    Hgb urine dipstick TRACE (*)    Protein, ur >300 (*)    All other components within normal limits  COMPREHENSIVE METABOLIC PANEL - Abnormal; Notable for the following:    Potassium 2.5 (*)    Glucose, Bld 132 (*)    BUN 25 (*)    Creatinine, Ser 2.34 (*)    Total Protein 5.7 (*)    Albumin 1.3 (*)    Total Bilirubin <0.2 (*)    GFR calc non Af Amer 32 (*)    GFR calc Af Amer 37 (*)    All other components within normal limits  URINE MICROSCOPIC-ADD ON - Abnormal; Notable for the following:    Casts HYALINE CASTS (*)    All other components within normal limits  POCT I-STAT, CHEM 8 - Abnormal; Notable for the following:    Sodium 136 (*)    Potassium 2.7 (*)    Chloride 94 (*)    BUN 24 (*)    Creatinine, Ser 2.40 (*)    Glucose, Bld 136 (*)    Hemoglobin 9.2 (*)    HCT 27.0 (*)    All other components within normal limits  MAGNESIUM  BASIC METABOLIC PANEL   Imaging Review No results found.  EKG Interpretation    Date/Time:  Thursday December 04 2013 20:41:03 EST Ventricular Rate:  77 PR Interval:  154 QRS Duration: 108 QT Interval:  393 QTC Calculation: 445 R Axis:   -6 Text Interpretation:  Sinus rhythm Probable left ventricular hypertrophy Nonspecific T wave abnormality No significant change since last tracing Confirmed by STEINL  MD, KEVIN ([id number]) on 12/04/2013 8:48:16 PM            MDM   1. Hypokalemia   2. Anemia   3. CKD (chronic kidney disease)   4. Diabetes mellitus   5. Diuretic-induced hypokalemia   6. Weakness generalized      Patient is a 48 year old male jaundiced on initial exam not hypotensive here. Found to have a potassium of 2.7 on  i-stat that we'll repeat with a CMP patient will be given IV potassium. Patient found to have an elevated creatinine of 2.4.  Likely admit for aki and hypokalemia.  Hypokalemia most likely due to lasix admin, with recent resolution of leg swelling, though concern with jaundiced appearance.   Patient will be admitted due to hypokalemia.  Given one K rider while in the ED, hospitalist consulted.     Melene Planan Duff Pozzi, MD 12/05/13 [phone]

## 2013-12-04 NOTE — ED Notes (Signed)
CBG requested by doctor Adela LankFloyd.  Results were 134.  Reported to doctor and RN

## 2013-12-05 ENCOUNTER — Encounter (HOSPITAL_COMMUNITY): Payer: Self-pay | Admitting: Urology

## 2013-12-05 ENCOUNTER — Inpatient Hospital Stay (HOSPITAL_COMMUNITY): Payer: Medicaid Other

## 2013-12-05 DIAGNOSIS — E876 Hypokalemia: Secondary | ICD-10-CM | POA: Diagnosis present

## 2013-12-05 DIAGNOSIS — E8809 Other disorders of plasma-protein metabolism, not elsewhere classified: Secondary | ICD-10-CM | POA: Diagnosis present

## 2013-12-05 DIAGNOSIS — E440 Moderate protein-calorie malnutrition: Secondary | ICD-10-CM

## 2013-12-05 LAB — CBC
HEMATOCRIT: 24.2 % — AB (ref 39.0–52.0)
Hemoglobin: 8.5 g/dL — ABNORMAL LOW (ref 13.0–17.0)
MCH: 28.4 pg (ref 26.0–34.0)
MCHC: 35.1 g/dL (ref 30.0–36.0)
MCV: 80.9 fL (ref 78.0–100.0)
Platelets: 617 10*3/uL — ABNORMAL HIGH (ref 150–400)
RBC: 2.99 MIL/uL — ABNORMAL LOW (ref 4.22–5.81)
RDW: 12.6 % (ref 11.5–15.5)
WBC: 15.1 10*3/uL — AB (ref 4.0–10.5)

## 2013-12-05 LAB — BASIC METABOLIC PANEL
BUN: 22 mg/dL (ref 6–23)
BUN: 23 mg/dL (ref 6–23)
CALCIUM: 8.2 mg/dL — AB (ref 8.4–10.5)
CHLORIDE: 99 meq/L (ref 96–112)
CO2: 28 mEq/L (ref 19–32)
CO2: 28 mEq/L (ref 19–32)
Calcium: 8.1 mg/dL — ABNORMAL LOW (ref 8.4–10.5)
Chloride: 104 mEq/L (ref 96–112)
Creatinine, Ser: 2.16 mg/dL — ABNORMAL HIGH (ref 0.50–1.35)
Creatinine, Ser: 2.18 mg/dL — ABNORMAL HIGH (ref 0.50–1.35)
GFR calc Af Amer: 40 mL/min — ABNORMAL LOW (ref >90)
GFR calc non Af Amer: 35 mL/min — ABNORMAL LOW (ref >90)
GFR calc non Af Amer: 35 mL/min — ABNORMAL LOW (ref >90)
GFR, EST AFRICAN AMERICAN: 41 mL/min — AB (ref >90)
GLUCOSE: 105 mg/dL — AB (ref 70–99)
Glucose, Bld: 117 mg/dL — ABNORMAL HIGH (ref 70–99)
POTASSIUM: 3.7 meq/L (ref 3.7–5.3)
Potassium: 2.8 mEq/L — CL (ref 3.7–5.3)
SODIUM: 138 meq/L (ref 137–147)
SODIUM: 141 meq/L (ref 137–147)

## 2013-12-05 LAB — GLUCOSE, CAPILLARY
GLUCOSE-CAPILLARY: 111 mg/dL — AB (ref 70–99)
Glucose-Capillary: 401 mg/dL — ABNORMAL HIGH (ref 70–99)
Glucose-Capillary: 423 mg/dL — ABNORMAL HIGH (ref 70–99)

## 2013-12-05 LAB — MRSA PCR SCREENING: MRSA by PCR: NEGATIVE

## 2013-12-05 LAB — HEMOGLOBIN A1C
HEMOGLOBIN A1C: 17.5 % — AB (ref <5.7)
MEAN PLASMA GLUCOSE: 456 mg/dL — AB (ref <117)

## 2013-12-05 MED ORDER — INSULIN ASPART 100 UNIT/ML ~~LOC~~ SOLN
5.0000 [IU] | Freq: Once | SUBCUTANEOUS | Status: AC
  Administered 2013-12-05: 5 [IU] via SUBCUTANEOUS

## 2013-12-05 MED ORDER — DEXTROSE 50 % IV SOLN: 50.0000 mL | Freq: Once | INTRAVENOUS | Status: AC | PRN

## 2013-12-05 MED ORDER — PANTOPRAZOLE SODIUM 40 MG PO TBEC
40.0000 mg | DELAYED_RELEASE_TABLET | Freq: Every day | ORAL | Status: DC
  Administered 2013-12-05 – 2013-12-07 (×3): 40 mg via ORAL
  Filled 2013-12-05 (×3): qty 1

## 2013-12-05 MED ORDER — ONDANSETRON HCL 4 MG/2ML IJ SOLN: 4.0000 mg | Freq: Four times a day (QID) | INTRAMUSCULAR | Status: DC | PRN

## 2013-12-05 MED ORDER — GLUCOSE 40 % PO GEL: 1.0000 | ORAL | Status: DC | PRN

## 2013-12-05 MED ORDER — POTASSIUM CHLORIDE IN NACL 20-0.9 MEQ/L-% IV SOLN
INTRAVENOUS | Status: AC
  Administered 2013-12-05: 02:00:00 via INTRAVENOUS
  Administered 2013-12-05: 16:00:00 75 mL/h via INTRAVENOUS
  Administered 2013-12-06: 06:00:00 via INTRAVENOUS
  Filled 2013-12-05 (×3): qty 1000

## 2013-12-05 MED ORDER — INSULIN ASPART 100 UNIT/ML ~~LOC~~ SOLN
0.0000 [IU] | Freq: Three times a day (TID) | SUBCUTANEOUS | Status: DC
  Administered 2013-12-05: 18:00:00 9 [IU] via SUBCUTANEOUS
  Administered 2013-12-06: 7 [IU] via SUBCUTANEOUS

## 2013-12-05 MED ORDER — SODIUM CHLORIDE 0.9 % IJ SOLN
3.0000 mL | Freq: Two times a day (BID) | INTRAMUSCULAR | Status: DC
  Administered 2013-12-05 – 2013-12-08 (×5): 3 mL via INTRAVENOUS

## 2013-12-05 MED ORDER — DEXTROSE 50 % IV SOLN: 25.0000 mL | Freq: Once | INTRAVENOUS | Status: AC | PRN

## 2013-12-05 MED ORDER — POLYETHYLENE GLYCOL 3350 17 G PO PACK
17.0000 g | PACK | Freq: Every day | ORAL | Status: DC
  Administered 2013-12-05 – 2013-12-08 (×4): 17 g via ORAL
  Filled 2013-12-05 (×4): qty 1

## 2013-12-05 MED ORDER — GLUCERNA SHAKE PO LIQD
237.0000 mL | Freq: Three times a day (TID) | ORAL | Status: DC
  Administered 2013-12-05 – 2013-12-08 (×10): 237 mL via ORAL
  Filled 2013-12-05 (×3): qty 237

## 2013-12-05 MED ORDER — POTASSIUM CHLORIDE 10 MEQ/100ML IV SOLN
10.0000 meq | INTRAVENOUS | Status: AC
  Administered 2013-12-05 (×4): 10 meq via INTRAVENOUS
  Filled 2013-12-05 (×4): qty 100

## 2013-12-05 NOTE — ED Provider Notes (Signed)
I saw and evaluated the patient, reviewed the resident's note and I agree with the findings and plan.  EKG Interpretation    Date/Time:  Thursday December 04 2013 20:41:03 EST Ventricular Rate:  77 PR Interval:  154 QRS Duration: 108 QT Interval:  393 QTC Calculation: 445 R Axis:   -6 Text Interpretation:  Sinus rhythm Probable left ventricular hypertrophy Nonspecific T wave abnormality No significant change since last tracing Confirmed by Denton LankSTEINL  MD, Zakiah Beckerman ([id number]) on 12/04/2013 8:48:16 PM            Above ecg interpreted by me.  Pt w gen weakness, poor po intake.  Labs c/w AKI, hypokalemia.  Ivf. Potassium. Admit. abd soft nt. Chest cta.   Suzi RootsKevin E Natallia Stellmach, MD 12/05/13 [phone]

## 2013-12-05 NOTE — Progress Notes (Signed)
CRITICAL VALUE ALERT  Critical value received:  cbg 423  Date of notification:  12/05/13  Time of notification:  2315  Critical value read back:yes  Nurse who received alert:  Angie  MD notified (1st page):  Burnadette PeterLynch  Time of first page:  2320  MD notified (2nd page):  Time of second page:  Responding MD:  Burnadette PeterLynch  Time MD responded:  2325

## 2013-12-05 NOTE — Progress Notes (Signed)
INITIAL NUTRITION ASSESSMENT  DOCUMENTATION CODES Per approved criteria  -Non-severe (moderate) malnutrition in the context of chronic illness   INTERVENTION:  Glucerna Shake PO TID, each supplement provides 220 kcal and 10 grams of protein  NUTRITION DIAGNOSIS: Malnutrition related to chronic illness as evidenced by mild depletion of subcutaneous fat and muscle mass.  Goal: Intake to meet >90% of estimated nutrition needs.  Monitor:  PO intake, labs, weight trend.  Reason for Assessment: MST  48 y.o. male  Admitting Dx: Diuretic-induced hypokalemia  ASSESSMENT: 48 yo male h/o ckd, dm, htn has been having weakness for 3 weeks progressively getting worse. No focal neuro deficits. No n/v/d. Had some generalized swelling last month and was started on lasix. Admitted with significant hypokalemia likely due to recent diuretic use.  Patient with some weight loss, but unsure of amount. He appears very thin. Patient consumed 100% of breakfast this morning.  Nutrition Focused Physical Exam:  Subcutaneous Fat:  Orbital Region: mild-moderate depletion Upper Arm Region: mild-moderate depletion Thoracic and Lumbar Region: mild-moderate depletion  Muscle:  Temple Region: mild-moderate depletion Clavicle Bone Region: mild-moderate depletion Clavicle and Acromion Bone Region: mild-moderate depletion Scapular Bone Region: mild-moderate depletion Dorsal Hand: mild-moderate depletion Patellar Region: severe depletion Anterior Thigh Region: mild-moderate depletion Posterior Calf Region: severe depletion  Edema: none  Pt meets criteria for non-severe (moderate) MALNUTRITION in the context of chronic illness as evidenced by mild depletion of subcutaneous fat and muscle mass.  Height: Ht Readings from Last 1 Encounters:  12/05/13 6\' 1"  (1.854 m)    Weight: Wt Readings from Last 1 Encounters:  12/05/13 146 lb 11.2 oz (66.543 kg)    Ideal Body Weight: 83.6 kg  % Ideal Body  Weight: 80%  Wt Readings from Last 10 Encounters:  12/05/13 146 lb 11.2 oz (66.543 kg)    Usual Body Weight: unknown   BMI:  Body mass index is 19.36 kg/(m^2).  Estimated Nutritional Needs: Kcal: 2200-2400 Protein: 110-130 gm Fluid: 1.5-2 L  Skin: no wounds  Diet Order: Carb Control  EDUCATION NEEDS: -Education not appropriate at this time   Intake/Output Summary (Last 24 hours) at 12/05/13 1009 Last data filed at 12/05/13 0900  Gross per 24 hour  Intake    240 ml  Output    225 ml  Net     15 ml    Last BM: 1/3   Labs:   Recent Labs Lab 12/04/13 2003 12/04/13 2031 12/05/13 0025 12/05/13 0530  NA 136* 138 138 141  K 2.7* 2.5* 2.8* 3.7  CL 94* 96 99 104  CO2  --  30 28 28   BUN 24* 25* 23 22  CREATININE 2.40* 2.34* 2.16* 2.18*  CALCIUM  --  8.5 8.1* 8.2*  MG  --  2.1  --   --   GLUCOSE 136* 132* 117* 105*    CBG (last 3)   Recent Labs  12/04/13 2002 12/05/13 0615  GLUCAP 134* 111*    Scheduled Meds: . insulin aspart  0-9 Units Subcutaneous TID WC  . sodium chloride  3 mL Intravenous Q12H    Continuous Infusions: . 0.9 % NaCl with KCl 20 mEq / L 75 mL/hr at 12/05/13 [id number]    Past Medical History  Diagnosis Date  . Diabetes mellitus without complication   . Hypertension   . Chronic kidney disease     per wife this admission, 12/05/2013    Past Surgical History  Procedure Laterality Date  . Toe amputation Right Second toe  Molli Barrows, RD, LDN, [place] Pager [phone] After Hours Pager [phone]

## 2013-12-05 NOTE — Progress Notes (Signed)
[facility] PROGRESS NOTE  [name] ZOX:[id number] DOB: Jul 20, 1966 DOA: 12/04/2013 PCP: No PCP Per Patient  Assessment/Plan: 1-Diuretic-induced hypokalemia: After repletion now resolved. No frank indication for diuretics.  -Will avoid diuretics -continue IVF's and electrolytes repletion as needed -K 3.7  2-Weakness generalized: Secondary to dehydration and poor intake last 2-3 weeks. -Patient feeling better and has been encouraged to increase activity level.  3-Acute on CKD (chronic kidney disease): GFR appears to be stage 2-3 at baseline. Continue IV fluids, avoid nephrotoxic agents. Basic metabolic no morning.  4-Diabetes mellitus: Continue sliding scale insulin. Will check A1c.  5-Anemia: Appears to be secondary to anemia of chronic disease. No signs of acute bleeding. -And and will follow CBC  6-Hypoalbuminemia and Malnutrition of moderate degree: Do to acute illness and decrease by mouth intake.  -Will start Glucerna.  -Nutrition service on board.  7-Constipation: will check Abd x-ray. -start miralax.  8-GERD: will start protonix.  Code Status: Full Family Communication: No family at bedside Disposition Plan: Home when medically stable.   Consultants:  None  Procedures:  See below for x-ray reports.  Antibiotics:  None   HPI/Subjective: Afebrile, no chest pain, no abdominal pain, patient reports some nausea and reflux symptoms this morning. Feel a whole lot better than prior to admission.  Objective: Filed Vitals:   12/05/13 1100  BP: 132/78  Pulse:   Temp: 98.2 F (36.8 C)  Resp: 18    Intake/Output Summary (Last 24 hours) at 12/05/13 1257 Last data filed at 12/05/13 0900  Gross per 24 hour  Intake    640 ml  Output   1205 ml  Net   -565 ml   Filed Weights   12/05/13 0133 12/05/13 0142  Weight: 66.543 kg (146 lb 11.2 oz) 66.543 kg (146 lb 11.2 oz)    Exam:   General:  Afebrile, complaining of nausea, no CP or  SOB  Cardiovascular: S1 and s2, no rubs or gallops; no JVD or murmurs  Respiratory: Clear to auscultation, except for scattered rhonchi  Abdomen: Soft, nontender, nondistended, positive bowel sounds.  Musculoskeletal: No edema, full range of motion, no joint swelling.  Data Reviewed: Basic Metabolic Panel:  Recent Labs Lab 12/04/13 2003 12/04/13 2031 12/05/13 0025 12/05/13 0530  NA 136* 138 138 141  K 2.7* 2.5* 2.8* 3.7  CL 94* 96 99 104  CO2  --  30 28 28   GLUCOSE 136* 132* 117* 105*  BUN 24* 25* 23 22  CREATININE 2.40* 2.34* 2.16* 2.18*  CALCIUM  --  8.5 8.1* 8.2*  MG  --  2.1  --   --    Liver Function Tests:  Recent Labs Lab 12/04/13 2031  AST 16  ALT 8  ALKPHOS 113  BILITOT <0.2*  PROT 5.7*  ALBUMIN 1.3*   CBC:  Recent Labs Lab 12/04/13 1924 12/04/13 2003 12/05/13 0530  WBC 16.5*  --  15.1*  HGB 9.2* 9.2* 8.5*  HCT 26.3* 27.0* 24.2*  MCV 80.4  --  80.9  PLT 763*  --  617*   CBG:  Recent Labs Lab 12/04/13 2002 12/05/13 0615  GLUCAP 134* 111*    Recent Results (from the past 240 hour(s))  MRSA PCR SCREENING     Status: None   Collection Time    12/05/13  1:54 AM      Result Value Range Status   MRSA by PCR NEGATIVE  NEGATIVE Final   Comment:  The GeneXpert MRSA Assay (FDA     approved for NASAL specimens     only), is one component of a     comprehensive MRSA colonization     surveillance program. It is not     intended to diagnose MRSA     infection nor to guide or     monitor treatment for     MRSA infections.     Studies: No results found.  Scheduled Meds: . feeding supplement (GLUCERNA SHAKE)  237 mL Oral TID BM  . insulin aspart  0-9 Units Subcutaneous TID WC  . pantoprazole  40 mg Oral Q1200  . polyethylene glycol  17 g Oral Daily  . sodium chloride  3 mL Intravenous Q12H   Continuous Infusions: . 0.9 % NaCl with KCl 20 mEq / L 75 mL/hr at 12/05/13 0214     Time spent: >30  minutes    Keaunna Skipper  [facility] Pager [phone]. If 7PM-7AM, please contact night-coverage at [email or web address], password [facility] 12/05/2013, 12:57 PM  LOS: 1 day

## 2013-12-05 NOTE — Progress Notes (Signed)
Pt arrived to floor. Oriented to room and callbell. Wife in room with patient and oriented to room and call bell. Questions answered. Patient VSS A/O x4. Will continue to monitor. Huel Coventryosenberger, Charlotte Fidalgo A, RN

## 2013-12-05 NOTE — Progress Notes (Signed)
Pt now says he's had 3 med good BM's throughout later part of the day. Feeling better.

## 2013-12-05 NOTE — Care Management Note (Addendum)
    Page 1 of 1   12/08/2013     5:49:39 PM   CARE MANAGEMENT NOTE 12/08/2013  Patient:  [name], [name]   Account Number:  [id number]  Date Initiated:  12/05/2013  Documentation initiated by:  Mariann Laster  Subjective/Objective Assessment:   Admitted with Hypokalemia, DM, Heart disease     Action/Plan:   CM will follow for disposition needs.   Anticipated DC Date:  12/08/2013   Anticipated DC Plan:  [place]  CM consult      Choice offered to / List presented to:             Status of service:  Completed, signed off Medicare Important Message given?   (If response is "NO", the following Medicare IM given date fields will be blank) Date Medicare IM given:   Date Additional Medicare IM given:    Discharge Disposition:  HOME/SELF CARE  Per UR Regulation:  Reviewed for med. necessity/level of care/duration of stay  If discussed at [place] of Stay Meetings, dates discussed:    Comments:  12/08/2013 D/c to home Meds:  $4.00 list Crystal Hutchinson RN, BSN, MSHL, CCM 12/08/2013  12/05/2013 Last UR Date 12/05/2013 Social:  SPANISH SPEAKING  from home with friend and children. Self Pay:  CM contacted [place] [phone] (spanish speaking).  Wilhemena Durie confirms she has met with patient and plans f/u with patient post d/c to complete MCD application. CM will review meds at time of d/c for $4.00 list review and/or MED Match needs. CM has requested d/c MD consideration to $4.00 med list at time of d/c d/t SELF PAY status. Crystal Hutchinson RN, BSN, [place], CCM 12/05/2013

## 2013-12-05 NOTE — Progress Notes (Signed)
Pt up ad lib in room c/o little discomfort LLQ was given miralax earlier in day. Pt stated he had not had a good Bm in about 10 days . ABD soft active bowel sounds. ABD xray shows gas.

## 2013-12-05 NOTE — Progress Notes (Signed)
UR completed. Patient changed to inpatient r/t requiring IVF @ 75cc/hr and con't monitoring BMET

## 2013-12-06 DIAGNOSIS — E8809 Other disorders of plasma-protein metabolism, not elsewhere classified: Secondary | ICD-10-CM

## 2013-12-06 DIAGNOSIS — J309 Allergic rhinitis, unspecified: Secondary | ICD-10-CM

## 2013-12-06 DIAGNOSIS — D72829 Elevated white blood cell count, unspecified: Secondary | ICD-10-CM

## 2013-12-06 LAB — BASIC METABOLIC PANEL
BUN: 24 mg/dL — AB (ref 6–23)
CHLORIDE: 107 meq/L (ref 96–112)
CO2: 26 mEq/L (ref 19–32)
CREATININE: 2.11 mg/dL — AB (ref 0.50–1.35)
Calcium: 8.1 mg/dL — ABNORMAL LOW (ref 8.4–10.5)
GFR calc Af Amer: 42 mL/min — ABNORMAL LOW (ref >90)
GFR, EST NON AFRICAN AMERICAN: 36 mL/min — AB (ref >90)
Glucose, Bld: 345 mg/dL — ABNORMAL HIGH (ref 70–99)
Potassium: 4 mEq/L (ref 3.7–5.3)
Sodium: 142 mEq/L (ref 137–147)

## 2013-12-06 LAB — GLUCOSE, CAPILLARY
GLUCOSE-CAPILLARY: 331 mg/dL — AB (ref 70–99)
GLUCOSE-CAPILLARY: 259 mg/dL — AB (ref 70–99)
Glucose-Capillary: 333 mg/dL — ABNORMAL HIGH (ref 70–99)
Glucose-Capillary: 286 mg/dL — ABNORMAL HIGH (ref 70–99)

## 2013-12-06 MED ORDER — LORATADINE 10 MG PO TABS
10.0000 mg | ORAL_TABLET | Freq: Every day | ORAL | Status: DC
  Administered 2013-12-06 – 2013-12-08 (×3): 10 mg via ORAL
  Filled 2013-12-06 (×3): qty 1

## 2013-12-06 MED ORDER — INSULIN ASPART 100 UNIT/ML ~~LOC~~ SOLN
0.0000 [IU] | Freq: Three times a day (TID) | SUBCUTANEOUS | Status: DC
  Administered 2013-12-06: 12:00:00 11 [IU] via SUBCUTANEOUS
  Administered 2013-12-06: 8 [IU] via SUBCUTANEOUS
  Administered 2013-12-07: 13:00:00 via SUBCUTANEOUS
  Administered 2013-12-07: 5 [IU] via SUBCUTANEOUS
  Administered 2013-12-07: 8 [IU] via SUBCUTANEOUS
  Administered 2013-12-08: 1 [IU] via SUBCUTANEOUS
  Administered 2013-12-08: 3 [IU] via SUBCUTANEOUS

## 2013-12-06 MED ORDER — INSULIN DETEMIR 100 UNIT/ML ~~LOC~~ SOLN
10.0000 [IU] | Freq: Every day | SUBCUTANEOUS | Status: DC
  Filled 2013-12-06: qty 0.1

## 2013-12-06 MED ORDER — INSULIN DETEMIR 100 UNIT/ML ~~LOC~~ SOLN
15.0000 [IU] | Freq: Every day | SUBCUTANEOUS | Status: DC
Start: 2013-12-06 — End: 2013-12-07
  Administered 2013-12-06: 15 [IU] via SUBCUTANEOUS
  Filled 2013-12-06: qty 0.15

## 2013-12-06 MED ORDER — POTASSIUM CHLORIDE IN NACL 20-0.9 MEQ/L-% IV SOLN
INTRAVENOUS | Status: AC
  Administered 2013-12-06 – 2013-12-07 (×2): via INTRAVENOUS
  Filled 2013-12-06 (×2): qty 1000

## 2013-12-06 NOTE — Progress Notes (Signed)
[facility] PROGRESS NOTE  Siegfried Vieth WUJ:[id number] DOB: 11-Jun-1966 DOA: 12/04/2013 PCP: No PCP Per Patient  Assessment/Plan: 1-dehydration and Diuretic-induced hypokalemia: After repletion now resolved. No frank indication for diuretics as an outpatient.  -Will avoid diuretics -continue IVF's and electrolytes repletion as needed -K 4.0 -still positive orthostatic changes  2-Weakness generalized: Secondary to dehydration and poor intake for the last 2-3 weeks. -Patient feeling better and has been encouraged to increase activity level. -eating and drinking better now.  3-Acute on CKD (chronic kidney disease): per GFR appears to be stage 2-3 at baseline. Continue IV fluids, avoid nephrotoxic agents. Basic metabolic in the morning. -Cr 2.11  4-Diabetes mellitus: uncontrolled -A1C 17.5 -will add lantus to SSI  5-Anemia: Appears to be secondary to anemia of chronic disease. No signs of acute bleeding. -And and will follow CBC  6-Hypoalbuminemia and Malnutrition of moderate degree: Do to acute illness and decrease by mouth intake.  -Will continue Glucerna.  -Nutrition service on board.  7-Constipation:  -x-ray w/o abnormalities -patient has had 2 BM's (well form on 1/16)  8-GERD: will continue protonix. -denies nausea  9-positive orthostatic changes -will check cortisol level -continue IVF's -repeat orthostatic VS in am  10-leukocytosis: -appears to be demargination. No fever or sign of infection. -will repeat CBC in am  11-allergin rhinitis: -start loratadine  Code Status: Full Family Communication: No family at bedside Disposition Plan: Home when medically stable.   Consultants:  None  Procedures:  See below for x-ray reports.  Antibiotics:  None   HPI/Subjective: Afebrile, no chest pain, no abdominal pain. Reports nausea is resolved and that he is feeling better. Still dizzy when standing and positive orthostatic VS this  morning.  Objective: Filed Vitals:   12/06/13 1459  BP: 144/79  Pulse: 89  Temp: 98.3 F (36.8 C)  Resp: 20    Intake/Output Summary (Last 24 hours) at 12/06/13 1819 Last data filed at 12/06/13 1753  Gross per 24 hour  Intake 3133.75 ml  Output   1350 ml  Net 1783.75 ml   Filed Weights   12/05/13 0133 12/05/13 0142 12/06/13 0528  Weight: 66.543 kg (146 lb 11.2 oz) 66.543 kg (146 lb 11.2 oz) 67.9 kg (149 lb 11.1 oz)    Exam:   General:  Afebrile, no CP or SOB. Nausea resolved. Feeling better but still dizzy when standing.  Cardiovascular: S1 and s2, no rubs or gallops; no JVD or murmurs.  Respiratory: Clear to auscultation. No crackles.  Abdomen: Soft, nontender, nondistended, positive bowel sounds.  Musculoskeletal: No edema, full range of motion, no joint swelling.  Data Reviewed: Basic Metabolic Panel:  Recent Labs Lab 12/04/13 2003 12/04/13 2031 12/05/13 0025 12/05/13 0530 12/06/13 0441  NA 136* 138 138 141 142  K 2.7* 2.5* 2.8* 3.7 4.0  CL 94* 96 99 104 107  CO2  --  30 28 28 26   GLUCOSE 136* 132* 117* 105* 345*  BUN 24* 25* 23 22 24*  CREATININE 2.40* 2.34* 2.16* 2.18* 2.11*  CALCIUM  --  8.5 8.1* 8.2* 8.1*  MG  --  2.1  --   --   --    Liver Function Tests:  Recent Labs Lab 12/04/13 2031  AST 16  ALT 8  ALKPHOS 113  BILITOT <0.2*  PROT 5.7*  ALBUMIN 1.3*   CBC:  Recent Labs Lab 12/04/13 1924 12/04/13 2003 12/05/13 0530  WBC 16.5*  --  15.1*  HGB 9.2* 9.2* 8.5*  HCT 26.3* 27.0* 24.2*  MCV 80.4  --  80.9  PLT 763*  --  617*   CBG:  Recent Labs Lab 12/05/13 1607 12/05/13 2137 12/06/13 0651 12/06/13 1155 12/06/13 1612  GLUCAP 401* 423* 333* 331* 286*    Recent Results (from the past 240 hour(s))  MRSA PCR SCREENING     Status: None   Collection Time    12/05/13  1:54 AM      Result Value Range Status   MRSA by PCR NEGATIVE  NEGATIVE Final   Comment:            The GeneXpert MRSA Assay (FDA     approved for NASAL  specimens     only), is one component of a     comprehensive MRSA colonization     surveillance program. It is not     intended to diagnose MRSA     infection nor to guide or     monitor treatment for     MRSA infections.     Studies: Dg Abd 2 Views  12/05/2013   CLINICAL DATA:  Nausea, no bowel movement for 7 days  EXAM: ABDOMEN - 2 VIEW  COMPARISON:  None.  FINDINGS: Nonobstructive bowel gas pattern. Unremarkable colonic stool burden. No pneumoperitoneum, pneumatosis or portal venous gas.  No definite abnormal intra-abdominal calcifications.  Limited visualization of lower thorax is normal.  No acute osseus abnormalities.  IMPRESSION: Nonobstructive bowel gas pattern.   Electronically Signed   By: Simonne ComeJohn  Watts M.D.   On: 12/05/2013 16:35    Scheduled Meds: . feeding supplement (GLUCERNA SHAKE)  237 mL Oral TID BM  . insulin aspart  0-15 Units Subcutaneous TID WC  . insulin detemir  15 Units Subcutaneous QHS  . pantoprazole  40 mg Oral Q1200  . polyethylene glycol  17 g Oral Daily  . sodium chloride  3 mL Intravenous Q12H   Continuous Infusions: . 0.9 % NaCl with KCl 20 mEq / L       Time spent: >30 minutes    Larz Mark  [facility] Pager [phone]. If 7PM-7AM, please contact night-coverage at [email or web address], password [email or web address] 12/06/2013, 6:19 PM  LOS: 2 days

## 2013-12-06 NOTE — Progress Notes (Signed)
Orthostatic vital signs as follows: 154/85 (lying), 125/81 (sitting), 99/69 (standing) and 97/63 (standing).  Provider on call Burnadette PeterLynch made aware.  Awaiting response.

## 2013-12-06 NOTE — Progress Notes (Signed)
Alert and oriented.  Respiratory exchange even and unlabored.

## 2013-12-07 DIAGNOSIS — I951 Orthostatic hypotension: Secondary | ICD-10-CM

## 2013-12-07 LAB — CBC
HCT: 23 % — ABNORMAL LOW (ref 39.0–52.0)
Hemoglobin: 7.7 g/dL — ABNORMAL LOW (ref 13.0–17.0)
MCH: 28.3 pg (ref 26.0–34.0)
MCHC: 33.5 g/dL (ref 30.0–36.0)
MCV: 84.6 fL (ref 78.0–100.0)
PLATELETS: 586 10*3/uL — AB (ref 150–400)
RBC: 2.72 MIL/uL — ABNORMAL LOW (ref 4.22–5.81)
RDW: 13 % (ref 11.5–15.5)
WBC: 13.5 10*3/uL — ABNORMAL HIGH (ref 4.0–10.5)

## 2013-12-07 LAB — GLUCOSE, CAPILLARY
Glucose-Capillary: 222 mg/dL — ABNORMAL HIGH (ref 70–99)
Glucose-Capillary: 187 mg/dL — ABNORMAL HIGH (ref 70–99)
Glucose-Capillary: 260 mg/dL — ABNORMAL HIGH (ref 70–99)
Glucose-Capillary: 330 mg/dL — ABNORMAL HIGH (ref 70–99)

## 2013-12-07 LAB — BASIC METABOLIC PANEL
BUN: 24 mg/dL — AB (ref 6–23)
CO2: 23 mEq/L (ref 19–32)
CREATININE: 2.06 mg/dL — AB (ref 0.50–1.35)
Calcium: 8.1 mg/dL — ABNORMAL LOW (ref 8.4–10.5)
Chloride: 105 mEq/L (ref 96–112)
GFR calc Af Amer: 43 mL/min — ABNORMAL LOW (ref >90)
GFR, EST NON AFRICAN AMERICAN: 37 mL/min — AB (ref >90)
GLUCOSE: 239 mg/dL — AB (ref 70–99)
Potassium: 3.9 mEq/L (ref 3.7–5.3)
Sodium: 139 mEq/L (ref 137–147)

## 2013-12-07 LAB — RETICULOCYTES
RBC.: 3.03 MIL/uL — ABNORMAL LOW (ref 4.22–5.81)
Retic Count, Absolute: 45.5 10*3/uL (ref 19.0–186.0)
Retic Ct Pct: 1.5 % (ref 0.4–3.1)

## 2013-12-07 LAB — CORTISOL: Cortisol, Plasma: 3.9 ug/dL

## 2013-12-07 MED ORDER — POTASSIUM CHLORIDE IN NACL 20-0.9 MEQ/L-% IV SOLN
INTRAVENOUS | Status: AC
  Administered 2013-12-07: 22:00:00 via INTRAVENOUS
  Filled 2013-12-07: qty 1000

## 2013-12-07 MED ORDER — INSULIN DETEMIR 100 UNIT/ML ~~LOC~~ SOLN
20.0000 [IU] | Freq: Every day | SUBCUTANEOUS | Status: DC
  Filled 2013-12-07: qty 0.2

## 2013-12-07 MED ORDER — FLUDROCORTISONE ACETATE 0.1 MG PO TABS
0.1000 mg | ORAL_TABLET | Freq: Every day | ORAL | Status: DC
  Administered 2013-12-07 – 2013-12-08 (×2): 0.1 mg via ORAL
  Filled 2013-12-07 (×2): qty 1

## 2013-12-07 MED ORDER — INSULIN DETEMIR 100 UNIT/ML ~~LOC~~ SOLN
25.0000 [IU] | Freq: Every day | SUBCUTANEOUS | Status: DC
  Administered 2013-12-07: 25 [IU] via SUBCUTANEOUS
  Filled 2013-12-07 (×2): qty 0.25

## 2013-12-07 NOTE — Progress Notes (Signed)
Patient's AM orthostatic vital signs were positive. Patient became dizzy and unsteady and was unable to complete the second standing pressure.  Patient currently resting comfortably, positive orthostatics reported to oncoming primary RN to follow up with MD.  Troy SineWalker, Kaoru Benda Fregeau M

## 2013-12-07 NOTE — Progress Notes (Addendum)
Alert and oriented.  Orthrostat BP positive.  MD aware.

## 2013-12-07 NOTE — Progress Notes (Signed)
[facility] PROGRESS NOTE  [name] ZOX:[id number] DOB: [birth year] DOA: 12/04/2013 PCP: No PCP Per Patient  Assessment/Plan: 1-dehydration and Diuretic-induced hypokalemia: After repletion now resolved. No frank indication for diuretics as an outpatient.  -Will avoid diuretics -continue IVF's and electrolytes repletion as needed -K 3.9 -still positive orthostatic changes; cortisol 3.9 (in acute setting of stress) -will start florinef  2-Weakness generalized: Secondary to dehydration and poor intake for the last 2-3 weeks. -Patient feeling better and has been encouraged to increase activity level. -eating and drinking better now.  3-Acute on CKD (chronic kidney disease): per GFR appears to be stage 2-3 at baseline. Continue IV fluids, avoid nephrotoxic agents. Basic metabolic in the morning. -Cr 2.06  4-Diabetes mellitus: uncontrolled -A1C 17.5 -will continue lantus to SSI -CBG's better  5-Anemia: Appears to be secondary to anemia of chronic disease. No signs of acute bleeding. -And and will follow CBC  6-Hypoalbuminemia and Malnutrition of moderate degree: Do to acute illness and decrease by mouth intake.  -Will continue Glucerna.  -Nutrition service on board.  7-Constipation:  -x-ray w/o abnormalities -patient has had 2 BM's (well form on 1/16)  8-GERD: will continue protonix. -denies nausea  9-positive orthostatic changes -will check cortisol level -continue IVF's -repeat orthostatic VS in am  10-leukocytosis: -appears to be demargination. No fever or sign of infection. 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   Consultants:  None  Procedures:  See below for x-ray reports.  Antibiotics:  None    HPI/Subjective: Afebrile, no chest pain, no abdominal pain. Reports nausea is resolved and that he is feeling better and eating better. Orthostatic changes still positive. Low cortisol.  Objective: Filed Vitals:   12/07/13 1500  BP: 143/80  Pulse: 96  Temp: 98 F (36.7 C)  Resp: 18    Intake/Output Summary (Last 24 hours) at 12/07/13 1831 Last data filed at 12/07/13 1821  Gross per 24 hour  Intake 2300.42 ml  Output    500 ml  Net 1800.42 ml   Filed Weights   12/05/13 0142 12/06/13 0528 12/07/13 0644  Weight: 66.543 kg (146 lb 11.2 oz) 67.9 kg (149 lb 11.1 oz) 70.444 kg (155 lb 4.8 oz)    Exam:   General:  Afebrile, no CP or SOB. Nausea resolved. Feeling better but still dizzy when standing.  Cardiovascular: S1 and s2, no rubs or gallops; no JVD or murmurs.  Respiratory: Clear to auscultation. No crackles.  Abdomen: Soft, nontender, nondistended, positive bowel sounds.  Musculoskeletal: No edema, full range of motion, no joint swelling.  Data Reviewed: Basic Metabolic Panel:  Recent Labs Lab 12/04/13 2003 12/04/13 2031 12/05/13 0025 12/05/13 0530 12/06/13 0441 12/07/13 0544  NA 136* 138 138 141 142 139  K 2.7* 2.5* 2.8* 3.7 4.0 3.9  CL 94* 96 99 104 107 105  CO2  --  30 28 28 26 23   GLUCOSE 136* 132* 117* 105* 345* 239*  BUN 24* 25* 23 22 24* 24*  CREATININE 2.40* 2.34* 2.16* 2.18* 2.11* 2.06*  CALCIUM  --  8.5 8.1* 8.2* 8.1* 8.1*  MG  --  2.1  --   --   --   --    Liver Function Tests:  Recent Labs Lab 12/04/13 2031  AST 16  ALT 8  ALKPHOS 113  BILITOT <0.2*  PROT 5.7*  ALBUMIN 1.3*   CBC:  Recent Labs Lab 12/04/13 1924 12/04/13 2003 12/05/13 0530 12/07/13 0544  WBC 16.5*  --  15.1* 13.5*  HGB 9.2* 9.2* 8.5* 7.7*  HCT 26.3* 27.0* 24.2* 23.0*  MCV 80.4  --  80.9 84.6  PLT 763*  --  617* 586*   CBG:  Recent Labs Lab 12/06/13 1612 12/06/13 2119 12/07/13 0609 12/07/13 1109 12/07/13 1555  GLUCAP 286* 259* 222* 187* 260*     Recent Results (from the past 240 hour(s))  MRSA PCR SCREENING     Status: None   Collection Time    12/05/13  1:54 AM      Result Value Range Status   MRSA by PCR NEGATIVE  NEGATIVE Final   Comment:            The GeneXpert MRSA Assay (FDA     approved for NASAL specimens     only), is one component of a     comprehensive MRSA colonization     surveillance program. It is not     intended to diagnose MRSA     infection nor to guide or     monitor treatment for     MRSA infections.     Studies: No results found.  Scheduled Meds: . feeding supplement (GLUCERNA SHAKE)  237 mL Oral TID BM  . fludrocortisone  0.1 mg Oral Daily  . insulin aspart  0-15 Units Subcutaneous TID WC  . insulin detemir  25 Units Subcutaneous QHS  . loratadine  10 mg Oral Daily  . pantoprazole  40 mg Oral Q1200  . polyethylene glycol  17 g Oral Daily  . sodium chloride  3 mL Intravenous Q12H   Continuous Infusions: . 0.9 % NaCl with KCl 20 mEq / L       Time spent: >30 minutes    [name]  [facility] Pager [phone]. If 7PM-7AM, please contact night-coverage at [email or web address], password [email or web address] 12/07/2013, 6:31 PM  LOS: 3 days

## 2013-12-08 DIAGNOSIS — I10 Essential (primary) hypertension: Secondary | ICD-10-CM

## 2013-12-08 LAB — BASIC METABOLIC PANEL
BUN: 32 mg/dL — ABNORMAL HIGH (ref 6–23)
CALCIUM: 8 mg/dL — AB (ref 8.4–10.5)
CO2: 23 meq/L (ref 19–32)
Chloride: 109 mEq/L (ref 96–112)
Creatinine, Ser: 2.13 mg/dL — ABNORMAL HIGH (ref 0.50–1.35)
GFR calc Af Amer: 41 mL/min — ABNORMAL LOW (ref >90)
GFR, EST NON AFRICAN AMERICAN: 36 mL/min — AB (ref >90)
GLUCOSE: 226 mg/dL — AB (ref 70–99)
POTASSIUM: 4.8 meq/L (ref 3.7–5.3)
Sodium: 141 mEq/L (ref 137–147)

## 2013-12-08 LAB — VITAMIN B12: VITAMIN B 12: 1349 pg/mL — AB (ref 211–911)

## 2013-12-08 LAB — CBC
HCT: 24 % — ABNORMAL LOW (ref 39.0–52.0)
HEMOGLOBIN: 8 g/dL — AB (ref 13.0–17.0)
MCH: 28.4 pg (ref 26.0–34.0)
MCHC: 33.3 g/dL (ref 30.0–36.0)
MCV: 85.1 fL (ref 78.0–100.0)
Platelets: 641 10*3/uL — ABNORMAL HIGH (ref 150–400)
RBC: 2.82 MIL/uL — AB (ref 4.22–5.81)
RDW: 13.1 % (ref 11.5–15.5)
WBC: 12.2 10*3/uL — ABNORMAL HIGH (ref 4.0–10.5)

## 2013-12-08 LAB — FERRITIN: Ferritin: 404 ng/mL — ABNORMAL HIGH (ref 22–322)

## 2013-12-08 LAB — GLUCOSE, CAPILLARY
GLUCOSE-CAPILLARY: 142 mg/dL — AB (ref 70–99)
GLUCOSE-CAPILLARY: 179 mg/dL — AB (ref 70–99)

## 2013-12-08 LAB — FOLATE: FOLATE: 11.7 ng/mL

## 2013-12-08 LAB — IRON AND TIBC
Iron: 31 ug/dL — ABNORMAL LOW (ref 42–135)
SATURATION RATIOS: 19 % — AB (ref 20–55)
TIBC: 159 ug/dL — AB (ref 215–435)
UIBC: 128 ug/dL (ref 125–400)

## 2013-12-08 MED ORDER — FLUDROCORTISONE ACETATE 0.1 MG PO TABS: 0.1000 mg | ORAL_TABLET | Freq: Every day | ORAL | Status: AC

## 2013-12-08 MED ORDER — GLUCERNA SHAKE PO LIQD: 237.0000 mL | Freq: Three times a day (TID) | ORAL | Status: AC

## 2013-12-08 MED ORDER — LORATADINE 10 MG PO TABS: 10.0000 mg | ORAL_TABLET | Freq: Every day | ORAL | Status: AC

## 2013-12-08 MED ORDER — INSULIN ASPART 100 UNIT/ML ~~LOC~~ SOLN: 5.0000 [IU] | Freq: Three times a day (TID) | SUBCUTANEOUS | Status: AC

## 2013-12-08 MED ORDER — AMLODIPINE BESYLATE 5 MG PO TABS: 5.0000 mg | ORAL_TABLET | Freq: Every day | ORAL | Status: AC

## 2013-12-08 MED ORDER — OMEPRAZOLE 40 MG PO CPDR: 40.0000 mg | DELAYED_RELEASE_CAPSULE | Freq: Every day | ORAL | Status: AC

## 2013-12-08 MED ORDER — INSULIN DETEMIR 100 UNIT/ML ~~LOC~~ SOLN: 25.0000 [IU] | Freq: Every day | SUBCUTANEOUS | Status: AC

## 2013-12-08 NOTE — Discharge Summary (Signed)
Physician Discharge Summary  [name] YNW:[id number] DOB: 1965-12-18 DOA: 12/04/2013  PCP: No PCP Per Patient  Admit date: 12/04/2013 Discharge date: 12/08/2013  Time spent: >30 minutes  Recommendations for Outpatient Follow-up:  1-BMET to follow electrolytes and renal function 2-reassess BP and adjust medications 3-CBC to follow Hgb and WBC's trend 4-close follow up to diabetes and adjustments to hypoglycemic regimen.  Discharge Diagnoses:  Diuretic-induced hypokalemia Weakness generalized CKD (chronic kidney disease) Diabetes mellitus Anemia of chronic disease Hypokalemia Hypoalbuminemia Malnutrition of moderate degree HTN Orthostatic hypotension  Discharge Condition: stable and improved. Will discharge home with family care. Follow up with PCP in outpatient setting in 1 week  Diet recommendation: low carbohydrates and low sodium diet  Filed Weights   12/05/13 0142 12/06/13 0528 12/07/13 0644  Weight: 66.543 kg (146 lb 11.2 oz) 67.9 kg (149 lb 11.1 oz) 70.444 kg (155 lb 4.8 oz)    History of present illness:  48 yo male h/o ckd, dm, htn has been having weakness for 3 weeks progressively getting worse. No focal neuro deficits. No n/v/d. Had some generalized swelling last month and was started on lasix. Since then his swelling has improved. Not on k pills. Also on lisinopril, hctz, metformin, and insulin (unsure of the doses and has not brought meds). No fevers. No sob no cp. k found to be 2.5. He does not know what his baseline cr is.   Hospital Course:  1-dehydration and Diuretic-induced hypokalemia: After repletion now resolved. No frank indication for diuretics as an outpatient.  -Will avoid diuretics  -K 3.9  -cortisol 3.9 (in acute setting of stress); started on florinef  -at discharge no dizziness, BP stable.  2-Weakness generalized: Secondary to dehydration and poor intake for the last 2-3 weeks.  -Patient feeling better and has been encouraged to increase  activity level.  -eating and drinking better now.  -low cortisol also contributing. -started on florinef  3-Acute on CKD (chronic kidney disease): per GFR appears to be stage 3 at baseline.  -advised to keep himself well hydratedContinue IV fluids, -avoid nephrotoxic agents.  -repeat Basic metabolic during follow up visit  -Cr 2.1 at discharge   4-Diabetes mellitus: uncontrolled  -A1C 17.5  -will discharge on levemir and novolog -metformin discontinued  -CBG's a lot better with current regimen -outpatient follow up with PCP for further adjustments on hypoglycemic regimen  5-Anemia: anemia panel checked (ferritin 404 and iron 31) -findings consistent with AOCD  -Hgb at discharge 8.0 -patient taking MV with iron.  6-Hypoalbuminemia and Malnutrition of moderate degree: Do to acute illness and decrease by mouth intake.  -Will continue Glucerna.  -encourage to increase PO intake  7-Constipation:  -x-ray w/o abnormalities  -patient has had 2 BM's (well form on 1/16 and then another on 1/18) -advise to keep himself hydrated and with good nutrition   8-GERD: will continue protonix.  -denies nausea or vomiting  9-orthostatic hypotension -cortisol level 3.9 in acute stress -good response to florinef -will discharge on 0.1mg  of florinef and patient will follow with PCP for further adjustments.  10-leukocytosis:  -appears to be demargination. No fever or signs of infection.  -WBC's trending down properly and almost at baseline at discharge -CBC during outpatient follow up  11-allergic rhinitis:  -continue loratadine   12-HTN: will treat with amlodipine.   Procedures:  See below for x-ray reports   Consultations:  None   Discharge Exam: Filed Vitals:   12/08/13 1036  BP: 128/77  Pulse: 122  Temp: 98.7 F (  37.1 C)  Resp: 18   General: Afebrile, no CP or SOB. Nausea resolved. Feeling better but still dizzy when standing.  Cardiovascular: S1 and s2, no rubs or  gallops; no JVD or murmurs.  Respiratory: Clear to auscultation. No crackles.  Abdomen: Soft, nontender, nondistended, positive bowel sounds.  Musculoskeletal: No edema, full range of motion, no joint swelling.   Discharge Instructions  Discharge Orders   Future Orders Complete By Expires   Diet - low sodium heart healthy  As directed    Discharge instructions  As directed    Comments:     Arrange follow up with PCP in 1 week Take medications as prescribed Keep yourself well hydrated Follow a low sodium and low carb diet       Medication List         amLODipine 5 MG tablet  Commonly known as:  NORVASC  Take 1 tablet (5 mg total) by mouth daily.  Start taking on:  12/09/2013     feeding supplement (GLUCERNA SHAKE) Liqd  Take 237 mLs by mouth 3 (three) times daily between meals.     fludrocortisone 0.1 MG tablet  Commonly known as:  FLORINEF  Take 1 tablet (0.1 mg total) by mouth daily.     insulin aspart 100 UNIT/ML injection  Commonly known as:  novoLOG  Inject 5 Units into the skin 3 (three) times daily with meals.     insulin detemir 100 UNIT/ML injection  Commonly known as:  LEVEMIR  Inject 0.25 mLs (25 Units total) into the skin at bedtime.     loratadine 10 MG tablet  Commonly known as:  CLARITIN  Take 1 tablet (10 mg total) by mouth daily.     omeprazole 40 MG capsule  Commonly known as:  PRILOSEC  Take 1 capsule (40 mg total) by mouth daily.       No Known Allergies     Follow-up Information   Follow up with [facility]    . Schedule an appointment as soon as possible for a visit in 1 week.   Contact information:   [address] [place] [address] [phone]       The results of significant diagnostics from this hospitalization (including imaging, microbiology, ancillary and laboratory) are listed below for reference.    Significant Diagnostic Studies: Dg Abd 2 Views  12/05/2013   CLINICAL DATA:   Nausea, no bowel movement for 7 days  EXAM: ABDOMEN - 2 VIEW  COMPARISON:  None.  FINDINGS: Nonobstructive bowel gas pattern. Unremarkable colonic stool burden. No pneumoperitoneum, pneumatosis or portal venous gas.  No definite abnormal intra-abdominal calcifications.  Limited visualization of lower thorax is normal.  No acute osseus abnormalities.  IMPRESSION: Nonobstructive bowel gas pattern.   Electronically Signed   By: Simonne Come M.D.   On: 12/05/2013 16:35    Microbiology: Recent Results (from the past 240 hour(s))  MRSA PCR SCREENING     Status: None   Collection Time    12/05/13  1:54 AM      Result Value Range Status   MRSA by PCR NEGATIVE  NEGATIVE Final   Comment:            The GeneXpert MRSA Assay (FDA     approved for NASAL specimens     only), is one component of a     comprehensive MRSA colonization     surveillance program. It is not     intended  to diagnose MRSA     infection nor to guide or     monitor treatment for     MRSA infections.     Labs: Basic Metabolic Panel:  Recent Labs Lab 12/04/13 2003  12/04/13 2031 12/05/13 0025 12/05/13 0530 12/06/13 0441 12/07/13 0544 12/08/13 0347  NA 136*  --  138 138 141 142 139 141  K 2.7*  --  2.5* 2.8* 3.7 4.0 3.9 4.8  CL 94*  --  96 99 104 107 105 109  CO2  --   < > 30 28 28 26 23 23   GLUCOSE 136*  --  132* 117* 105* 345* 239* 226*  BUN 24*  --  25* 23 22 24* 24* 32*  CREATININE 2.40*  --  2.34* 2.16* 2.18* 2.11* 2.06* 2.13*  CALCIUM  --   < > 8.5 8.1* 8.2* 8.1* 8.1* 8.0*  MG  --   --  2.1  --   --   --   --   --   < > = values in this interval not displayed. Liver Function Tests:  Recent Labs Lab 12/04/13 2031  AST 16  ALT 8  ALKPHOS 113  BILITOT <0.2*  PROT 5.7*  ALBUMIN 1.3*   CBC:  Recent Labs Lab 12/04/13 1924 12/04/13 2003 12/05/13 0530 12/07/13 0544 12/08/13 0347  WBC 16.5*  --  15.1* 13.5* 12.2*  HGB 9.2* 9.2* 8.5* 7.7* 8.0*  HCT 26.3* 27.0* 24.2* 23.0* 24.0*  MCV 80.4  --  80.9  84.6 85.1  PLT 763*  --  617* 586* 641*   CBG:  Recent Labs Lab 12/07/13 1109 12/07/13 1555 12/07/13 2037 12/08/13 0559 12/08/13 1107  GLUCAP 187* 260* 330* 142* 179*    Signed:  Lin Hackmann  [facility] 12/08/2013, 11:27 AM

## 2013-12-08 NOTE — Progress Notes (Signed)
UR completed Ramces Shomaker K. Willmar Stockinger, RN, BSN, MSHL, CCM  12/08/2013 5:48 PM

## 2013-12-09 LAB — PROTEIN ELECTROPHORESIS, SERUM
ALPHA-1-GLOBULIN: 7.4 % — AB (ref 2.9–4.9)
Albumin ELP: 40.1 % — ABNORMAL LOW (ref 55.8–66.1)
Alpha-2-Globulin: 22.6 % — ABNORMAL HIGH (ref 7.1–11.8)
Beta 2: 8 % — ABNORMAL HIGH (ref 3.2–6.5)
Beta Globulin: 5.8 % (ref 4.7–7.2)
Gamma Globulin: 16.1 % (ref 11.1–18.8)
M-Spike, %: NOT DETECTED g/dL
Total Protein ELP: 4.1 g/dL — ABNORMAL LOW (ref 6.0–8.3)

## 2013-12-09 LAB — UIFE/LIGHT CHAINS/TP QN, 24-HR UR
Albumin, U: DETECTED
Alpha 1, Urine: DETECTED — AB
Alpha 2, Urine: DETECTED — AB
BETA UR: DETECTED — AB
FREE LAMBDA LT CHAINS, UR: 4.6 mg/dL — AB (ref 0.02–0.67)
Free Kappa Lt Chains,Ur: 18.3 mg/dL — ABNORMAL HIGH (ref 0.14–2.42)
Free Kappa/Lambda Ratio: 3.98 ratio (ref 2.04–10.37)
Gamma Globulin, Urine: DETECTED — AB
Total Protein, Urine: 233.8 mg/dL
